# Patient Record
Sex: Male | Born: 1970 | Race: Black or African American | Hispanic: No | Marital: Married | Smoking: Never smoker
Health system: Southern US, Community
[De-identification: ages and names within clinical notes are randomized; demographics above are authoritative.]

## PROBLEM LIST (undated history)

## (undated) DIAGNOSIS — K219 Gastro-esophageal reflux disease without esophagitis: Secondary | ICD-10-CM

## (undated) DIAGNOSIS — Z8489 Family history of other specified conditions: Secondary | ICD-10-CM

## (undated) DIAGNOSIS — G8929 Other chronic pain: Secondary | ICD-10-CM

## (undated) DIAGNOSIS — M5412 Radiculopathy, cervical region: Secondary | ICD-10-CM

## (undated) DIAGNOSIS — R569 Unspecified convulsions: Secondary | ICD-10-CM

## (undated) DIAGNOSIS — R519 Headache, unspecified: Secondary | ICD-10-CM

## (undated) DIAGNOSIS — F909 Attention-deficit hyperactivity disorder, unspecified type: Secondary | ICD-10-CM

## (undated) DIAGNOSIS — F0781 Postconcussional syndrome: Secondary | ICD-10-CM

## (undated) DIAGNOSIS — M1990 Unspecified osteoarthritis, unspecified site: Secondary | ICD-10-CM

## (undated) DIAGNOSIS — I10 Essential (primary) hypertension: Secondary | ICD-10-CM

## (undated) DIAGNOSIS — R51 Headache: Secondary | ICD-10-CM

## (undated) DIAGNOSIS — M545 Low back pain, unspecified: Secondary | ICD-10-CM

## (undated) DIAGNOSIS — M48061 Spinal stenosis, lumbar region without neurogenic claudication: Secondary | ICD-10-CM

## (undated) HISTORY — DX: Spinal stenosis, lumbar region without neurogenic claudication: M48.061

## (undated) HISTORY — PX: WRIST SURGERY: SHX841

## (undated) HISTORY — DX: Radiculopathy, cervical region: M54.12

## (undated) HISTORY — DX: Attention-deficit hyperactivity disorder, unspecified type: F90.9

## (undated) HISTORY — DX: Postconcussional syndrome: F07.81

---

## 2008-03-10 HISTORY — PX: SHOULDER ARTHROSCOPY W/ ROTATOR CUFF REPAIR: SHX2400

## 2013-03-10 HISTORY — PX: ACHILLES TENDON SURGERY: SHX542

## 2014-03-10 HISTORY — PX: APPENDECTOMY: SHX54

## 2014-09-20 ENCOUNTER — Observation Stay (HOSPITAL_COMMUNITY): Payer: BLUE CROSS/BLUE SHIELD

## 2014-09-20 ENCOUNTER — Emergency Department (HOSPITAL_COMMUNITY): Payer: BLUE CROSS/BLUE SHIELD

## 2014-09-20 ENCOUNTER — Encounter (HOSPITAL_COMMUNITY): Payer: Self-pay | Admitting: Emergency Medicine

## 2014-09-20 ENCOUNTER — Observation Stay (HOSPITAL_COMMUNITY)
Admission: EM | Admit: 2014-09-20 | Discharge: 2014-09-21 | Disposition: A | Discharge status: Home / Self Care | Payer: BLUE CROSS/BLUE SHIELD | Attending: Internal Medicine | Admitting: Internal Medicine

## 2014-09-20 DIAGNOSIS — D649 Anemia, unspecified: Secondary | ICD-10-CM | POA: Insufficient documentation

## 2014-09-20 DIAGNOSIS — M5412 Radiculopathy, cervical region: Secondary | ICD-10-CM

## 2014-09-20 DIAGNOSIS — R531 Weakness: Secondary | ICD-10-CM

## 2014-09-20 DIAGNOSIS — M4722 Other spondylosis with radiculopathy, cervical region: Secondary | ICD-10-CM | POA: Insufficient documentation

## 2014-09-20 DIAGNOSIS — R202 Paresthesia of skin: Secondary | ICD-10-CM | POA: Diagnosis not present

## 2014-09-20 DIAGNOSIS — R0789 Other chest pain: Secondary | ICD-10-CM

## 2014-09-20 DIAGNOSIS — I10 Essential (primary) hypertension: Secondary | ICD-10-CM | POA: Diagnosis not present

## 2014-09-20 DIAGNOSIS — R079 Chest pain, unspecified: Secondary | ICD-10-CM | POA: Diagnosis not present

## 2014-09-20 DIAGNOSIS — R52 Pain, unspecified: Secondary | ICD-10-CM

## 2014-09-20 DIAGNOSIS — M25512 Pain in left shoulder: Secondary | ICD-10-CM | POA: Diagnosis present

## 2014-09-20 HISTORY — DX: Unspecified osteoarthritis, unspecified site: M19.90

## 2014-09-20 HISTORY — DX: Essential (primary) hypertension: I10

## 2014-09-20 HISTORY — DX: Family history of other specified conditions: Z84.89

## 2014-09-20 HISTORY — DX: Low back pain, unspecified: M54.50

## 2014-09-20 HISTORY — DX: Headache: R51

## 2014-09-20 HISTORY — DX: Other chronic pain: G89.29

## 2014-09-20 HISTORY — DX: Headache, unspecified: R51.9

## 2014-09-20 HISTORY — DX: Unspecified convulsions: R56.9

## 2014-09-20 HISTORY — DX: Low back pain: M54.5

## 2014-09-20 LAB — RAPID URINE DRUG SCREEN, HOSP PERFORMED
Amphetamines: NOT DETECTED
BARBITURATES: NOT DETECTED
BENZODIAZEPINES: NOT DETECTED
Cocaine: NOT DETECTED
Opiates: POSITIVE — AB
Tetrahydrocannabinol: NOT DETECTED

## 2014-09-20 LAB — BASIC METABOLIC PANEL
Anion gap: 6 (ref 5–15)
BUN: 9 mg/dL (ref 6–20)
CALCIUM: 9 mg/dL (ref 8.9–10.3)
CHLORIDE: 108 mmol/L (ref 101–111)
CO2: 27 mmol/L (ref 22–32)
Creatinine, Ser: 0.88 mg/dL (ref 0.61–1.24)
Glucose, Bld: 110 mg/dL — ABNORMAL HIGH (ref 65–99)
Potassium: 4 mmol/L (ref 3.5–5.1)
SODIUM: 141 mmol/L (ref 135–145)

## 2014-09-20 LAB — URINALYSIS, ROUTINE W REFLEX MICROSCOPIC
Bilirubin Urine: NEGATIVE
Glucose, UA: NEGATIVE mg/dL
HGB URINE DIPSTICK: NEGATIVE
Ketones, ur: NEGATIVE mg/dL
Leukocytes, UA: NEGATIVE
Nitrite: NEGATIVE
Protein, ur: NEGATIVE mg/dL
Specific Gravity, Urine: 1.016 (ref 1.005–1.030)
Urobilinogen, UA: 0.2 mg/dL (ref 0.0–1.0)
pH: 6.5 (ref 5.0–8.0)

## 2014-09-20 LAB — IRON AND TIBC
Iron: 60 ug/dL (ref 45–182)
SATURATION RATIOS: 20 % (ref 17.9–39.5)
TIBC: 304 ug/dL (ref 250–450)
UIBC: 244 ug/dL

## 2014-09-20 LAB — LIPID PANEL
CHOL/HDL RATIO: 3 ratio
Cholesterol: 166 mg/dL (ref 0–200)
HDL: 56 mg/dL (ref >40)
LDL Cholesterol: 98 mg/dL (ref 0–99)
Triglycerides: 58 mg/dL (ref <150)
VLDL: 12 mg/dL (ref 0–40)

## 2014-09-20 LAB — I-STAT TROPONIN, ED: Troponin i, poc: 0.01 ng/mL (ref 0.00–0.08)

## 2014-09-20 LAB — CBC WITH DIFFERENTIAL/PLATELET
BASOS ABS: 0 10*3/uL (ref 0.0–0.1)
Basophils Relative: 0 % (ref 0–1)
EOS ABS: 0.1 10*3/uL (ref 0.0–0.7)
Eosinophils Relative: 2 % (ref 0–5)
HCT: 40.2 % (ref 39.0–52.0)
Hemoglobin: 11.4 g/dL — ABNORMAL LOW (ref 13.0–17.0)
LYMPHS ABS: 1.9 10*3/uL (ref 0.7–4.0)
LYMPHS PCT: 30 % (ref 12–46)
MCH: 22.9 pg — ABNORMAL LOW (ref 26.0–34.0)
MCHC: 28.4 g/dL — AB (ref 30.0–36.0)
MCV: 80.9 fL (ref 78.0–100.0)
MONOS PCT: 12 % (ref 3–12)
Monocytes Absolute: 0.8 10*3/uL (ref 0.1–1.0)
NEUTROS ABS: 3.5 10*3/uL (ref 1.7–7.7)
Neutrophils Relative %: 56 % (ref 43–77)
Platelets: 180 10*3/uL (ref 150–400)
RBC: 4.97 MIL/uL (ref 4.22–5.81)
RDW: 13.9 % (ref 11.5–15.5)
WBC: 6.3 10*3/uL (ref 4.0–10.5)

## 2014-09-20 LAB — TROPONIN I: Troponin I: <0.03 ng/mL (ref <0.031)

## 2014-09-20 LAB — RETICULOCYTES
RBC.: 5.08 MIL/uL (ref 4.22–5.81)
Retic Count, Absolute: 55.9 10*3/uL (ref 19.0–186.0)
Retic Ct Pct: 1.1 % (ref 0.4–3.1)

## 2014-09-20 LAB — FERRITIN: FERRITIN: 57 ng/mL (ref 24–336)

## 2014-09-20 MED ORDER — NITROGLYCERIN 0.4 MG SL SUBL
0.4000 mg | SUBLINGUAL_TABLET | SUBLINGUAL | Status: DC | PRN
  Administered 2014-09-20 (×2): 0.4 mg via SUBLINGUAL
  Filled 2014-09-20: qty 1

## 2014-09-20 MED ORDER — ENOXAPARIN SODIUM 40 MG/0.4ML ~~LOC~~ SOLN
40.0000 mg | SUBCUTANEOUS | Status: DC
  Administered 2014-09-20: 40 mg via SUBCUTANEOUS
  Filled 2014-09-20 (×2): qty 0.4

## 2014-09-20 MED ORDER — ASPIRIN EC 81 MG PO TBEC
81.0000 mg | DELAYED_RELEASE_TABLET | Freq: Every day | ORAL | Status: DC
Start: 2014-09-21 — End: 2014-09-21
  Administered 2014-09-21: 81 mg via ORAL
  Filled 2014-09-20: qty 1

## 2014-09-20 MED ORDER — MORPHINE SULFATE 4 MG/ML IJ SOLN
2.0000 mg | Freq: Once | INTRAMUSCULAR | Status: AC
  Administered 2014-09-20: 2 mg via INTRAVENOUS
  Filled 2014-09-20: qty 1

## 2014-09-20 MED ORDER — MORPHINE SULFATE 2 MG/ML IJ SOLN: 2.0000 mg | INTRAMUSCULAR | Status: DC | PRN

## 2014-09-20 MED ORDER — IBUPROFEN 200 MG PO TABS
600.0000 mg | ORAL_TABLET | Freq: Four times a day (QID) | ORAL | Status: DC | PRN
  Administered 2014-09-21 (×2): 600 mg via ORAL
  Filled 2014-09-20 (×2): qty 3

## 2014-09-20 MED ORDER — SODIUM CHLORIDE 0.9 % IV SOLN: Freq: Once | INTRAVENOUS | Status: DC

## 2014-09-20 MED ORDER — ASPIRIN 81 MG PO CHEW
324.0000 mg | CHEWABLE_TABLET | Freq: Once | ORAL | Status: AC
  Administered 2014-09-20: 324 mg via ORAL
  Filled 2014-09-20: qty 4

## 2014-09-20 MED ORDER — SODIUM CHLORIDE 0.9 % IJ SOLN: 3.0000 mL | Freq: Two times a day (BID) | INTRAMUSCULAR | Status: DC

## 2014-09-20 MED ORDER — SODIUM CHLORIDE 0.9 % IV SOLN
INTRAVENOUS | Status: DC
  Administered 2014-09-20: 150 mL/h via INTRAVENOUS
  Administered 2014-09-20: via INTRAVENOUS

## 2014-09-20 MED ORDER — SODIUM CHLORIDE 0.9 % IV SOLN
1000.0000 mL | Freq: Once | INTRAVENOUS | Status: AC
  Administered 2014-09-20: 1000 mL via INTRAVENOUS

## 2014-09-20 NOTE — ED Provider Notes (Signed)
Medical screening examination/treatment/procedure(s) were conducted as a shared visit with non-physician practitioner(s) and myself.  I personally evaluated the patient during the encounter.   EKG Interpretation   Date/Time:  Wednesday September 20 2014 09:31:08 EDT Ventricular Rate:  82 PR Interval:  143 QRS Duration: 88 QT Interval:  353 QTC Calculation: 412 R Axis:   74 Text Interpretation:  Sinus rhythm Borderline T abnormalities, diffuse  leads No old tracing to compare Confirmed by Annaliesa Blann  MD, Lasondra Hodgkins ([id number]) on  09/20/2014 9:40:17 AM     44yM with L shoulder/arm pain. 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 Progressed to L lateral neck and upper arm and also numbness in L hand. Had walked about a mile prior to the onset of symptoms. Doesn't feel like he strained LUE. Was holding marking stick with R hand.  Associated with nausea nausea and dizziness. Symptoms improved since onset, but still persistent vague pressure. No hx of similar type pain. Has hx of HTN. No known CAD. Reports previous stress testing ~10 years ago.   Concern for possible ACS.  EKG with nonspecific changes. Flattening/TWI inferiorly. No old for comparison. Initial troponin normal. Not clearly cervical radiculopathy or musculoskeletal. Concerning enough that I feel prudent to admit for r/o.   Jonathan RazorStephen Tatiyanna Lashley, MD 09/20/14 [phone]

## 2014-09-20 NOTE — H&P (Signed)
Date: 09/20/2014               Patient Name:  Jonathan Everett MRN: [id number]  DOB: [birth year] Age / Sex: 44 y.o., male   PCP: No primary care provider on file.         Medical Service: Internal Medicine Teaching Service         Attending Physician: Dr. Earl Lagos, MD    First Contact: Dr. Isabella Bowens Pager: [phone]  Second Contact: Dr. Yetta Barre Pager: [phone]       After Hours (After 5p/  First Contact Pager: [phone]  weekends / holidays): Second Contact Pager: [phone]   Chief Complaint: L shoulder and chest pain  History of Present Illness: [name] is a 44 year old man with history of hypertension presenting with L shoulder and chest pain. The pain started today while he was walking at work outside. He noticed a constant, cramping pain the back of his L shoulder. He then had numbness going down to his L hand. He felt lightheaded. He has lightheadedness with standing. The pain radiated up the L side of his neck. He had chest tightness in L lower chest that comes and goes. Resting makes the pain better and walking makes the pain worse. Deep breaths also make the pain worse. No change with leaning forward. He had associated diaphoresis and nausea. He denies emesis or shortness of breath. He reports L leg tingling. Denies fevers, chills, vision changes, cough, abdominal pain, diarrhea, hematochezia, melena, dysuria, hematuria, rash, weakness. Denies history of VTE.   Meds: Current Facility-Administered Medications  Medication Dose Route Frequency Provider Last Rate Last Dose  . 0.9 %  sodium chloride infusion   Intravenous Continuous Courtney Paris, MD      . Melene Muller ON 09/21/2014] aspirin EC tablet 81 mg  81 mg Oral Daily Courtney Paris, MD      . enoxaparin (LOVENOX) injection 40 mg  40 mg Subcutaneous Q24H Courtney Paris, MD      . morphine 2 MG/ML injection 2 mg  2 mg Intravenous Q4H PRN Courtney Paris, MD      . nitroGLYCERIN (NITROSTAT) SL tablet 0.4 mg  0.4 mg Sublingual Q5 Min x 3  PRN Danelle Berry, PA-C   0.4 mg at 09/20/14 1117  . sodium chloride 0.9 % injection 3 mL  3 mL Intravenous Q12H Courtney Paris, MD       No prescriptions prior to admission    Allergies: Allergies as of 09/20/2014  . (No Known Allergies)   Past Medical History  Diagnosis Date  . Hypertension    Past Surgical History  Procedure Laterality Date  . Appendectomy    . Rotator cuff repair    . Wrist surgery    . Achilles tendon surgery     No family history on file.   History   Social History  . Marital Status: Married    Spouse Name: N/A  . Number of Children: N/A  . Years of Education: N/A   Occupational History  . Not on file.   Social History Main Topics  . Smoking status: Never Smoker   . Smokeless tobacco: Not on file  . Alcohol Use: Yes     Comment: socially  . Drug Use: No  . Sexual Activity: Not on file   Other Topics Concern  . Not on file   Social History Narrative  . No narrative on file    Review of Systems: Constitutional: no fevers/chills  Eyes: no vision changes Ears, nose, mouth, throat, and face: no cough Respiratory: no shortness of breath Cardiovascular: +chest pain Gastrointestinal: +nausea, no vomiting, no abdominal pain, no constipation, no diarrhea Genitourinary: no dysuria, no hematuria Integument: no rash Hematologic/lymphatic: no bleeding/bruising, no edema Musculoskeletal: no arthralgias, no myalgias Neurological: +paresthesias, no weakness  Physical Exam: Blood pressure 124/75, pulse 64, temperature 98.6 F (37 C), temperature source Oral, resp. rate 16, height 6' (1.829 m), weight 204 lb 8 oz (92.761 kg), SpO2 98 %. General Apperance: NAD Head: Normocephalic, atraumatic Eyes: PERRL, EOMI, anicteric sclera Ears: Normal external ear canal Nose: Nares normal, septum midline, mucosa normal Throat: Lips, mucosa and tongue normal  Neck: Supple, trachea midline Back: No tenderness or bony abnormality  Lungs: Clear to auscultation  bilaterally. No wheezes, rhonchi or rales. Breathing comfortably on RA. Chest Wall: TTP in left lower chest, no deformity Heart: Regular rate and rhythm, no murmur/rub/gallop Abdomen: Soft, nontender, nondistended, no rebound/guarding Extremities: Normal, atraumatic, warm and well perfused, no edema Pulses: 2+ throughout Skin: No rashes or lesions Neurologic: Alert and oriented x 3. CN V1 and V2 decreased sensation. Otherwise CNII-XII intact. Decreased L grip but otherwise normal strength and sensation.  Lab results: Basic Metabolic Panel:  Recent Labs  21/30/8607/13/16 1002  NA 141  K 4.0  CL 108  CO2 27  GLUCOSE 110*  BUN 9  CREATININE 0.88  CALCIUM 9.0   CBC:  Recent Labs  09/20/14 1002  WBC 6.3  NEUTROABS 3.5  HGB 11.4*  HCT 40.2  MCV 80.9  PLT 180   Cardiac Enzymes:  Recent Labs  09/20/14 1002  TROPONINI <0.03   Urine Drug Screen: Drugs of Abuse  No results found for: LABOPIA, COCAINSCRNUR, LABBENZ, AMPHETMU, THCU, LABBARB   Urinalysis:  Recent Labs  09/20/14 1115  COLORURINE YELLOW  LABSPEC 1.016  PHURINE 6.5  GLUCOSEU NEGATIVE  HGBUR NEGATIVE  BILIRUBINUR NEGATIVE  KETONESUR NEGATIVE  PROTEINUR NEGATIVE  UROBILINOGEN 0.2  NITRITE NEGATIVE  LEUKOCYTESUR NEGATIVE    Imaging results:  Dg Chest 2 View  09/20/2014   CLINICAL DATA:  Left chest pain.  Shortness of breath.  EXAM: CHEST  2 VIEW  COMPARISON:  None.  FINDINGS: The heart size and mediastinal contours are within normal limits. Both lungs are clear. The visualized skeletal structures are unremarkable.  IMPRESSION: No active cardiopulmonary disease.   Electronically Signed   By: Gaylyn RongWalter  Liebkemann M.D.   On: 09/20/2014 10:56    Other results: EKG: Normal sinus rhythm, T wave inversion in III, V1, V3, no previous EKG for comparison  Assessment & Plan by Problem: Active Problems:   Left shoulder pain   Chest pain  Chest pain: Pain is reproducible on palpation making MSK etiology possible.  CXR with no acute cardiopulmonary process. Do not suspect PE as Wells score 0 and Geneva score 3 (heart rate 75-94) low risk for PE. Initial troponin negative and EKG without acute ischemic changes. His HEART score is 2 points and TIMI score is 0. He received ASA 325mg  x 1.  -UDS pending -repeat EKG in AM -trend troponins -lipid panel -hemoglobin A1c  -ASA 81mg  daily -ibuprofen 600mg  q6 hr prn pain -nitrostat SL 0.4mg  prn chest pain -tele  Paresthesias, mild weakness: concern for TIA/CVA versus radiculopathy -CT head  Anemia: Hgb on admission 11.4. No previous labs for comparison. MCV normal at 80.9. -Obtain Iron, TIBC, Ferritin -Reticulocytes -FOBT  FEN:  -heart healthy -NS @150ml /hr  VTE ppx: Lovenox  Dispo: Disposition is deferred  at this time, awaiting improvement of current medical problems. Anticipated discharge in approximately 1-2 day(s).   The patient does have a current PCP (No primary care provider on file.) and does not need an [facility] hospital follow-up appointment after discharge.  The patient does not know have transportation limitations that hinder transportation to clinic appointments.  Signed: Lora Paula, MD  09/20/2014, 1:50 PM

## 2014-09-20 NOTE — ED Notes (Signed)
Patient made aware of need for Urine specimen, urinal at bedside.

## 2014-09-20 NOTE — ED Provider Notes (Signed)
CSN: [id number]     Arrival date & time 09/20/14  [id number] History   First MD Initiated Contact with Patient 09/20/14 [id number]     Chief Complaint  Patient presents with  . Shoulder Pain  . Chest Pain     (Consider location/radiation/quality/duration/timing/severity/associated sxs/prior Treatment) Patient is a 44 y.o. male presenting with dizziness and shoulder pain. The history is provided by the patient. No language interpreter was used.  Dizziness Associated symptoms: chest pain, headaches, nausea and palpitations   Associated symptoms: no diarrhea, no shortness of breath, no vomiting and no weakness   Shoulder Pain Associated symptoms: back pain and neck pain   Associated symptoms: no fever     [name] is a 44 year old male with history of hypertension, who presents with sudden onset left back pain that occurred while walking at work. The pain radiated up his left side of his neck and to his left shoulder and down his left arm, with a squeezing and achy quality and associated numbness and tingling of his left hand.  He also had associated nausea, diaphoresis, lightheadedness, palpitations described as "a flutter" and headache.  The pain began at 8:30 this morning, and slightly decreased with resting, but has not completely resolved. He was able to have his wife get him from work and bring him to the emergency department where the pain has been persistent, but currently rated 8/10.  He did not have any associated shortness of breath, PND, orthopnea, lower extremity edema, vomiting, diarrhea, and abdominal pain.  He denies any smoking history, drinks wine socially on occasion.  He denies any illegal drug use.  He is currently on lisinopril 10 mg and HCTZ to treat his hypertension.  He reports a history of hypertension since he was 44 years old, and had a treadmill stress test approximately 10 years ago, which is reportedly negative.  It is not known the etiology of his hypertension, and he is  unsure if he's ever had a ECHO performed.  He denies any risk factors for blood clots, denies any cancer history, hypercoagulable genetic diseases, and denies any recent travel or period of stasis.    Past Medical History  Diagnosis Date  . Hypertension    Past Surgical History  Procedure Laterality Date  . Appendectomy    . Rotator cuff repair    . Wrist surgery    . Achilles tendon surgery     No family history on file. History  Substance Use Topics  . Smoking status: Never Smoker   . Smokeless tobacco: Not on file  . Alcohol Use: Yes     Comment: socially    Review of Systems  Constitutional: Positive for diaphoresis. Negative for fever.  Eyes: Negative for photophobia and visual disturbance.  Respiratory: Positive for chest tightness. Negative for cough, shortness of breath and wheezing.   Cardiovascular: Positive for chest pain and palpitations. Negative for leg swelling.  Gastrointestinal: Positive for nausea. Negative for vomiting, abdominal pain, diarrhea and abdominal distention.  Endocrine: Negative.   Genitourinary: Negative.   Musculoskeletal: Positive for back pain and neck pain. Negative for neck stiffness.  Skin: Negative.   Neurological: Positive for light-headedness, numbness and headaches. Negative for dizziness, tremors, seizures, syncope, facial asymmetry and weakness.  Psychiatric/Behavioral: Negative.     Allergies  Review of patient's allergies indicates no known allergies.  Home Medications   Prior to Admission medications   Not on File   BP 136/74 mmHg  Pulse 84  Temp(Src) 98.6 F (37  C) (Oral)  Resp 12  SpO2 100% Physical Exam  Constitutional: He is oriented to person, place, and time. Vital signs are normal. He appears well-developed and well-nourished. He does not appear ill. No distress.  Well-developed, well-nourished male, appears stated age, appears uncomfortable  HENT:  Head: Normocephalic and atraumatic.  Right Ear: External ear  normal.  Left Ear: External ear normal.  Nose: Nose normal.  Mouth/Throat: Oropharynx is clear and moist. No oropharyngeal exudate.  Eyes: Conjunctivae, EOM and lids are normal. Pupils are equal, round, and reactive to light. Right eye exhibits no discharge. Left eye exhibits no discharge. No scleral icterus.  Neck: Trachea normal, normal range of motion and phonation normal. Neck supple. Normal carotid pulses, no hepatojugular reflux and no JVD present. Carotid bruit is not present. No tracheal deviation present. No thyromegaly present.  Cardiovascular: Normal rate, regular rhythm, normal heart sounds and intact distal pulses.  PMI is not displaced.  Exam reveals no gallop, no friction rub and no decreased pulses.   No murmur heard. Pulses:      Carotid pulses are 2+ on the right side, and 2+ on the left side.      Radial pulses are 2+ on the right side, and 2+ on the left side.       Dorsalis pedis pulses are 2+ on the right side, and 2+ on the left side.  loccasional PVC's Pulses symmetrical and 2+ bilaterally and carotid, radial, DP artery, no bruits auscultated  Pulmonary/Chest: Effort normal and breath sounds normal. No respiratory distress. He has no wheezes. He has no rales. Chest wall is not dull to percussion. He exhibits no tenderness, no bony tenderness, no crepitus, no deformity and no retraction.  Abdominal: Soft. Bowel sounds are normal. He exhibits no distension and no mass. There is no tenderness. There is no rebound and no guarding.  Musculoskeletal: Normal range of motion. He exhibits no edema or tenderness.  Lymphadenopathy:    He has no cervical adenopathy.  Neurological: He is alert and oriented to person, place, and time. He has normal reflexes. No cranial nerve deficit. He exhibits normal muscle tone. Coordination normal.  Skin: Skin is warm, dry and intact. No rash noted. He is not diaphoretic. No cyanosis or erythema. No pallor. Nails show no clubbing.  No lower  extremity edema  Psychiatric: He has a normal mood and affect. His behavior is normal. Judgment and thought content normal.  Nursing note and vitals reviewed.   ED Course  Procedures (including critical care time) Labs Review Labs Reviewed  CBC WITH DIFFERENTIAL/PLATELET - Abnormal; Notable for the following:    Hemoglobin 11.4 (*)    MCH 22.9 (*)    MCHC 28.4 (*)    All other components within normal limits  BASIC METABOLIC PANEL - Abnormal; Notable for the following:    Glucose, Bld 110 (*)    All other components within normal limits  TROPONIN I  URINALYSIS, ROUTINE W REFLEX MICROSCOPIC (NOT AT [facility])  Rosezena Sensor, ED    Imaging Review Dg Chest 2 View  09/20/2014   CLINICAL DATA:  Left chest pain.  Shortness of breath.  EXAM: CHEST  2 VIEW  COMPARISON:  None.  FINDINGS: The heart size and mediastinal contours are within normal limits. Both lungs are clear. The visualized skeletal structures are unremarkable.  IMPRESSION: No active cardiopulmonary disease.   Electronically Signed   By: Gaylyn Rong M.D.   On: 09/20/2014 10:56     EKG Interpretation  Date/Time:  Wednesday September 20 2014 09:31:08 EDT Ventricular Rate:  82 PR Interval:  143 QRS Duration: 88 QT Interval:  353 QTC Calculation: 412 R Axis:   74 Text Interpretation:  Sinus rhythm Borderline T abnormalities, diffuse  leads No old tracing to compare Confirmed by Juleen China  MD, STEPHEN ([id number]) on  09/20/2014 9:40:17 AM      MDM   Final diagnoses:  None   Patient with chief complained of left shoulder and chest pain with radiation to left neck and jaw and left arm with associated numbness - concerning for ACS/STEMI/NSTEMI  Plan - EKG, CXR, CBC, BMP, UA, troponin, cardiac monitoring, ASA, morphine, sublingual NG  EKG reveals T-wave inversions in the inferior leads II, III, without any prior EKG to compare to.   Initial troponin is negative, labs pertinent for mild anemia Vital signs stable here in the  ER, without tachycardia, hypoxia or hypertension  Left-sided chest pain is now rated 3 out of 10, will give another NG Patient's presentation concerning for unstable angina/ACS, patient will likely need admission for R/O ACS/NSTEMI with serial cardiac enzymes.  Dr. Juleen China has seen and evaluated the pt.  Please see his documentation.  Hospitalist has been called for admission. 11:39 AM Danelle Berry, PA-C     Danelle Berry, PA-C 09/28/14 [id number]  Raeford Razor, MD 09/28/14 [phone]

## 2014-09-20 NOTE — ED Notes (Signed)
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 Pt alert, oriented x4, VSS.

## 2014-09-21 ENCOUNTER — Encounter: Payer: Self-pay | Admitting: Internal Medicine

## 2014-09-21 DIAGNOSIS — M5412 Radiculopathy, cervical region: Secondary | ICD-10-CM

## 2014-09-21 DIAGNOSIS — R079 Chest pain, unspecified: Secondary | ICD-10-CM

## 2014-09-21 DIAGNOSIS — D649 Anemia, unspecified: Secondary | ICD-10-CM | POA: Diagnosis not present

## 2014-09-21 DIAGNOSIS — I10 Essential (primary) hypertension: Secondary | ICD-10-CM | POA: Diagnosis not present

## 2014-09-21 DIAGNOSIS — M4722 Other spondylosis with radiculopathy, cervical region: Secondary | ICD-10-CM | POA: Diagnosis not present

## 2014-09-21 LAB — TROPONIN I: Troponin I: <0.03 ng/mL (ref <0.031)

## 2014-09-21 LAB — HEMOGLOBIN A1C
Hgb A1c MFr Bld: 5.2 % (ref 4.8–5.6)
Mean Plasma Glucose: 103 mg/dL

## 2014-09-21 LAB — TSH: TSH: 2.166 u[IU]/mL (ref 0.350–4.500)

## 2014-09-21 MED ORDER — IBUPROFEN 600 MG PO TABS: 600.0000 mg | ORAL_TABLET | Freq: Four times a day (QID) | ORAL | Status: DC | PRN

## 2014-09-21 NOTE — Progress Notes (Signed)
Subjective: No acute events overnight. Doing well this morning. He reports his pain is improved.   Objective: Vital signs in last 24 hours: Filed Vitals:   09/20/14 1308 09/20/14 1317 09/20/14 2105 09/21/14 0353  BP: 124/75  137/77 126/85  Pulse: 64  79 62  Temp: 98.2 F (36.8 C)  98.1 F (36.7 C) 98.2 F (36.8 C)  TempSrc:   Oral Oral  Resp: Height:  6' (1.829 m)    Weight:  204 lb 8 oz (92.761 kg)    SpO2:    100%   Weight change:   Intake/Output Summary (Last 24 hours) at 09/21/14 0955 Last data filed at 09/20/14 1900  Gross per 24 hour  Intake 1757.5 ml  Output      0 ml  Net 1757.5 ml   General Apperance: NAD HEENT: Normocephalic, atraumatic, PERRL, EOMI, anicteric sclera Neck: Supple, trachea midline Lungs: Clear to auscultation bilaterally. No wheezes, rhonchi or rales. Breathing comfortably Heart: Regular rate and rhythm, no murmur/rub/gallop Abdomen: Soft, nontender, nondistended, no rebound/guarding Extremities: Normal, atraumatic, warm and well perfused, no edema Pulses: 2+ throughout Skin: No rashes or lesions Neurologic: Alert and oriented x 3. CNII-XII intact. Normal strength and sensation  Lab Results: Basic Metabolic Panel:  Recent Labs Lab 09/20/14 1002  NA 141  K 4.0  CL 108  CO2 27  GLUCOSE 110*  BUN 9  CREATININE 0.88  CALCIUM 9.0   CBC:  Recent Labs Lab 09/20/14 1002  WBC 6.3  NEUTROABS 3.5  HGB 11.4*  HCT 40.2  MCV 80.9  PLT 180   Cardiac Enzymes:  Recent Labs Lab 09/20/14 1002 09/20/14 1544 09/21/14 0250  TROPONINI <0.03 <0.03 <0.03   Hemoglobin A1C:  Recent Labs Lab 09/20/14 1544  HGBA1C 5.2   Fasting Lipid Panel:  Recent Labs Lab 09/20/14 1544  CHOL 166  HDL 56  LDLCALC 98  TRIG 58  CHOLHDL 3.0   Thyroid Function Tests:  Recent Labs Lab 09/21/14 0400  TSH 2.166   Anemia Panel:  Recent Labs Lab 09/20/14 1544 09/20/14 1807  FERRITIN  --  57  TIBC  --  304  IRON  --  60    RETICCTPCT 1.1  --    Urine Drug Screen: Drugs of Abuse     Component Value Date/Time   LABOPIA POSITIVE* 09/20/2014 1115   COCAINSCRNUR NONE DETECTED 09/20/2014 1115   LABBENZ NONE DETECTED 09/20/2014 1115   AMPHETMU NONE DETECTED 09/20/2014 1115   THCU NONE DETECTED 09/20/2014 1115   LABBARB NONE DETECTED 09/20/2014 1115    Urinalysis:  Recent Labs Lab 09/20/14 1115  COLORURINE YELLOW  LABSPEC 1.016  PHURINE 6.5  GLUCOSEU NEGATIVE  HGBUR NEGATIVE  BILIRUBINUR NEGATIVE  KETONESUR NEGATIVE  PROTEINUR NEGATIVE  UROBILINOGEN 0.2  NITRITE NEGATIVE  LEUKOCYTESUR NEGATIVE    Studies/Results: Dg Chest 2 View  09/20/2014   CLINICAL DATA:  Left chest pain.  Shortness of breath.  EXAM: CHEST  2 VIEW  COMPARISON:  None.  FINDINGS: The heart size and mediastinal contours are within normal limits. Both lungs are clear. The visualized skeletal structures are unremarkable.  IMPRESSION: No active cardiopulmonary disease.   Electronically Signed   By: Gaylyn Rong M.D.   On: 09/20/2014 10:56   Dg Cervical Spine Complete  09/20/2014   CLINICAL DATA:  44 year old with left shoulder and arm pain. No acute injury. Initial encounter.  EXAM: CERVICAL SPINE  4+ VIEWS  COMPARISON:  None.  FINDINGS: The  prevertebral soft tissues are normal. The alignment is anatomic through T1. There is no evidence of acute fracture or traumatic subluxation. The C1-2 articulation appears normal in the AP projection. There is mild disc space loss and uncinate spurring in the lower cervical spine, greatest at C6-7. Oblique views demonstrate mild left-sided foraminal narrowing at C6-7.  IMPRESSION: No acute osseous findings or malalignment. Mild spondylosis with mild left-sided foraminal narrowing at C6-7.   Electronically Signed   By: Carey BullocksWilliam  Veazey M.D.   On: 09/20/2014 14:46   Ct Head Wo Contrast  09/20/2014   CLINICAL DATA:  Headache with left arm numbness today. Initial encounter.  EXAM: CT HEAD WITHOUT  CONTRAST  TECHNIQUE: Contiguous axial images were obtained from the base of the skull through the vertex without intravenous contrast.  COMPARISON:  None.  FINDINGS: There is no evidence of acute intracranial hemorrhage, mass lesion, brain edema or extra-axial fluid collection. The ventricles and subarachnoid spaces are appropriately sized for age. There is no CT evidence of acute cortical infarction. There is low-density within the sella turcica, suggesting possible partially empty sella.  There is a mucous retention cyst in the left maxillary sinus. The visualized paranasal sinuses, mastoid air cells and middle ears are otherwise clear. The calvarium is intact.  IMPRESSION: No acute intracranial findings. Possible partially empty sella and left maxillary sinus mucous retention cyst.   Electronically Signed   By: Carey BullocksWilliam  Veazey M.D.   On: 09/20/2014 15:04   Medications: I have reviewed the patient's current medications. Scheduled Meds: . aspirin EC  81 mg Oral Daily  . enoxaparin (LOVENOX) injection  40 mg Subcutaneous Q24H  . sodium chloride  3 mL Intravenous Q12H   Continuous Infusions:  PRN Meds:.ibuprofen, nitroGLYCERIN Assessment/Plan: Active Problems:   Left shoulder pain   Chest pain   Cervical radiculopathy  Chest pain: Pain is reproducible on palpation making MSK etiology likely. UDS unremarkable. Troponins negative x 3. Repeat EKG unremarkable. Total cholesterol 166, HDL 56 and LDL 98. Hgb A1c 5.2%. 3.7% 10-year risk of heart disease or stroke.  Cervical radiculopathy: CT head with no acute intracranial findings. XR cervical spine  With no acute osseous findings or malalignment. Mild spondylosis with mild left-sided foraminal narrowing at C6-7. -ibuprofen 600mg  q6 hr prn pain  Anemia: Hgb on admission 11.4. No previous labs for comparison. MCV normal at 80.9. Iron, TIBC, and Ferritin within normal limits. Reticulocytes normal. TSH wnl. Will have him follow up with PCP.  FEN: heart  healthy  VTE ppx: Lovenox  Dispo: Likely home today  The patient does not have a current PCP (Pcp Not In System) and does not need [facility] follow-[facility] appointment after discharge.  The patient does not know have transportation limitations that hinder transportation to clinic appointments.  .Services Needed at time of discharge: Y = Yes, Blank = No PT:   OT:   RN:   Equipment:   Other:       Lora PaulaJennifer T Breanne Olvera, MD 09/21/2014, 9:55 AM

## 2014-09-21 NOTE — Progress Notes (Signed)
Note for work delivered to pt and pt d/c'd via wheelchair with belongings with family, escorted by hospital volunteer.

## 2014-09-21 NOTE — Progress Notes (Signed)
Pt states he needs a note to return to work. Dr Isabella BowensKrall notified, will get a note for pt and bring it up.

## 2014-09-21 NOTE — Care Management Note (Signed)
Case Management Note  Patient Details  Name: Timoteo Gaulemus [facility] MRN: [id number] Date of Birth: 05/16/1970  Subjective/Objective: Pt was admitted with left shoulder pain                   Action/Plan:  Pt is independent from home, recently moved to area about a week ago, and therefore has not found a PCP in area.  CM will assist with locating PCP   Expected Discharge Date:                  Expected Discharge Plan:  Home/Self Care  In-House Referral:     Discharge planning Services  CM Consult (PCP)  Post Acute Care Choice:    Choice offered to:     DME Arranged:    DME Agency:     HH Arranged:    HH Agency:     Status of Service:  In process, will continue to follow  Medicare Important Message Given:    Date Medicare IM Given:    Medicare IM give by:    Date Additional Medicare IM Given:    Additional Medicare Important Message give by:     If discussed at Long Length of Stay Meetings, dates discussed:    Additional Comments: CM was informed by pt that he recently moved to the area.  CM provided Health Connect information to pt and instructed pt that this service would help him find a [facility] provider that would accept his insurance.  CM also informed pt that he could call the number on the back of his insurance card to request assistance with locating an in network provider.  CM will continue to monitor pt for dipsosition needs Cherylann ParrClaxton, Jhayla Podgorski S, RN 09/21/2014, 12:37 PM

## 2014-09-21 NOTE — Progress Notes (Signed)
D/c instructions reviewed with pt and family. Copy of instructions and script given to pt. Pt waiting for hospital volunteer to come with wheelchair for discharge.

## 2014-09-21 NOTE — Discharge Instructions (Signed)
Chest Pain It is often hard to give a specific diagnosis for the cause of chest pain. There is always a chance that your pain could be related to something serious, such as a heart attack or a blood clot in the lungs. You need to follow up with your health care provider for further evaluation. CAUSES   Heartburn.  Pneumonia or bronchitis.  Anxiety or stress.  Inflammation around your heart (pericarditis) or lung (pleuritis or pleurisy).  A blood clot in the lung.  A collapsed lung (pneumothorax). It can develop suddenly on its own (spontaneous pneumothorax) or from trauma to the chest.  Shingles infection (herpes zoster virus). The chest wall is composed of bones, muscles, and cartilage. Any of these can be the source of the pain.  The bones can be bruised by injury.  The muscles or cartilage can be strained by coughing or overwork.  The cartilage can be affected by inflammation and become sore (costochondritis). TREATMENT   Treatment depends on what may be causing your chest pain. Treatment may include:  Acid blockers for heartburn.  Anti-inflammatory medicine.  Pain medicine for inflammatory conditions.  Antibiotics if an infection is present.  Most of the time, nonspecific chest pain will improve within 2-3 days with rest and mild pain medicine.  HOME CARE INSTRUCTIONS   Do not use any tobacco products, including cigarettes, chewing tobacco, or electronic cigarettes.  Avoid drinking alcohol.  Only take medicine as directed by your health care provider.  Follow your health care provider's suggestions for further testing if your chest pain does not go away.  Keep any follow-up appointments you made. If you do not go to an appointment, you could develop lasting (chronic) problems with pain. If there is any problem keeping an appointment, call to reschedule. SEEK MEDICAL CARE IF:   Your chest pain does not go away, even after treatment.  You have a rash with  blisters on your chest.  You have a fever. SEEK IMMEDIATE MEDICAL CARE IF:   You have increased chest pain or pain that spreads to your arm, neck, jaw, back, or abdomen.  You have shortness of breath.  You have an increasing cough, or you cough up blood.  You have severe back or abdominal pain.  You feel nauseous or vomit.  You have severe weakness.  You faint.  You have chills. This is an emergency. Do not wait to see if the pain will go away. Get medical help at once. Call your local emergency services (911 in [place] Do not drive yourself to the hospital.   Cervical Radiculopathy Cervical radiculopathy means a nerve in the neck is pinched or bruised. This can cause pain or loss of feeling (numbness) that runs from your neck to your arm and fingers. HOME CARE   Put ice on the injured or painful area.  Put ice in a plastic bag.  Place a towel between your skin and the bag.  Leave the ice on for 15-20 minutes, 03-04 times a day, or as told by your doctor.  If ice does not help, you can try using heat. Take a warm shower or bath, or use a hot water bottle as told by your doctor.  You may try a gentle neck and shoulder massage.  Use a flat pillow when you sleep.  Only take medicines as told by your doctor.  GET HELP RIGHT AWAY IF:   Your pain gets worse and is not controlled with medicine.  You lose feeling or  feel weak in your hand, arm, face, or leg.  You have a fever or stiff neck.  You cannot control when you poop or pee (incontinence).  You have trouble with walking, balance, or speaking. MAKE SURE YOU:   Understand these instructions.  Will watch your condition.  Will get help right away if you are not doing well or get worse. Document Released: 02/13/2011 Document Revised: 05/19/2011 Document Reviewed: 02/13/2011 [facility] Patient Information 2015 [place], [place]. This information is not intended to replace advice given to you by your health care  provider. Make sure you discuss any questions you have with your health care provider.

## 2014-09-21 NOTE — Discharge Summary (Signed)
Name: Jonathan Everett MRN: [id number] DOB: 1970-06-07 44 y.o. PCP: Pcp Not In System  Date of Admission: 09/20/2014  9:21 AM Date of Discharge: 09/21/2014 Attending Physician: Earl Lagos, MD  Discharge Diagnosis: Principal Problem:   Chest pain Active Problems:   Cervical radiculopathy   Normocytic anemia  Discharge Medications:   Medication List    TAKE these medications        ibuprofen 600 MG tablet  Commonly known as:  ADVIL,MOTRIN  Take 1 tablet (600 mg total) by mouth every 6 (six) hours as needed for headache or moderate pain.        Disposition and follow-up:   Mr.Omair Caponi was discharged from [facility] in Stable condition.  At the hospital follow up visit please address:  1.  Chest pain: 3.7% 10-year risk of heart disease or stroke. If he has recurrent chest pain, may consider stress testing.   Cervical radiculopathy: Mild spondylosis with mild left-sided foraminal narrowing at C6-7. He was prescribed ibuprofen 600mg  q6 hr prn pain.  Normocytic Anemia: Hgb on admission 11.4. No previous labs for comparison. MCV normal at 80.9. Iron, TIBC, and Ferritin within normal limits. Reticulocytes normal. TSH wnl. Consider further workup.  2.  Labs / imaging needed at time of follow-up: None  3.  Pending labs/ test needing follow-up: None   Follow-up Appointments: Follow-up Information    Follow up with Primary Care Provider. Schedule an appointment as soon as possible for a visit in 1 week.   Why:  Hospital follow up      Follow up with Call number for [facility].   Why:  See the Health Connect paper that Case Management provided   Contact information:   Assistance from this service will help you find a [facility] provider that will accept your insurance      Discharge Instructions:     Discharge Instructions    Call MD for:  difficulty breathing, headache or visual disturbances    Complete by:  As directed      Call  MD for:  persistant dizziness or light-headedness    Complete by:  As directed      Call MD for:  persistant nausea and vomiting    Complete by:  As directed      Call MD for:  severe uncontrolled pain    Complete by:  As directed      Call MD for:  temperature >100.4    Complete by:  As directed      Increase activity slowly    Complete by:  As directed            Consultations: None  Procedures Performed:  Dg Chest 2 View  09/20/2014   CLINICAL DATA:  Left chest pain.  Shortness of breath.  EXAM: CHEST  2 VIEW  COMPARISON:  None.  FINDINGS: The heart size and mediastinal contours are within normal limits. Both lungs are clear. The visualized skeletal structures are unremarkable.  IMPRESSION: No active cardiopulmonary disease.   Electronically Signed   By: Gaylyn Rong M.D.   On: 09/20/2014 10:56   Dg Cervical Spine Complete  09/20/2014   CLINICAL DATA:  44 year old with left shoulder and arm pain. No acute injury. Initial encounter.  EXAM: CERVICAL SPINE  4+ VIEWS  COMPARISON:  None.  FINDINGS: The prevertebral soft tissues are normal. The alignment is anatomic through T1. There is no evidence of acute fracture or traumatic subluxation. The C1-2 articulation appears  normal in the AP projection. There is mild disc space loss and uncinate spurring in the lower cervical spine, greatest at C6-7. Oblique views demonstrate mild left-sided foraminal narrowing at C6-7.  IMPRESSION: No acute osseous findings or malalignment. Mild spondylosis with mild left-sided foraminal narrowing at C6-7.   Electronically Signed   By: Carey BullocksWilliam  Veazey M.D.   On: 09/20/2014 14:46   Ct Head Wo Contrast  09/20/2014   CLINICAL DATA:  Headache with left arm numbness today. Initial encounter.  EXAM: CT HEAD WITHOUT CONTRAST  TECHNIQUE: Contiguous axial images were obtained from the base of the skull through the vertex without intravenous contrast.  COMPARISON:  None.  FINDINGS: There is no evidence of acute  intracranial hemorrhage, mass lesion, brain edema or extra-axial fluid collection. The ventricles and subarachnoid spaces are appropriately sized for age. There is no CT evidence of acute cortical infarction. There is low-density within the sella turcica, suggesting possible partially empty sella.  There is a mucous retention cyst in the left maxillary sinus. The visualized paranasal sinuses, mastoid air cells and middle ears are otherwise clear. The calvarium is intact.  IMPRESSION: No acute intracranial findings. Possible partially empty sella and left maxillary sinus mucous retention cyst.   Electronically Signed   By: Carey BullocksWilliam  Veazey M.D.   On: 09/20/2014 15:04    Admission HPI: Mr. Timoteo Gaulemus Clucas is a 44 year old man with history of hypertension presenting with L shoulder and chest pain. The pain started today while he was walking at work outside. He noticed a constant, cramping pain the back of his L shoulder. He then had numbness going down to his L hand. He felt lightheaded. He has lightheadedness with standing. The pain radiated up the L side of his neck. He had chest tightness in L lower chest that comes and goes. Resting makes the pain better and walking makes the pain worse. Deep breaths also make the pain worse. No change with leaning forward. He had associated diaphoresis and nausea. He denies emesis or shortness of breath. He reports L leg tingling. Denies fevers, chills, vision changes, cough, abdominal pain, diarrhea, hematochezia, melena, dysuria, hematuria, rash, weakness. Denies history of VTE.   Hospital Course by problem list:   1. Chest pain: Pain is reproducible on palpation making MSK etiology possible. CXR with no acute cardiopulmonary process. Initial troponin negative and EKG without acute ischemic changes. He received ASA 325mg  x 1. He was admitted for further evaluation. UDS unremarkable. Troponins negative x 3. Repeat EKG unremarkable. Total cholesterol 166, HDL 56 and LDL 98.  Hgb A1c 5.2%. 3.7% 10-year risk of heart disease or stroke. At discharge, he had no further episodes of chest pain. If he has recurrent chest pain, may consider stress testing.   2. Cervical radiculopathy: CT head with no acute intracranial findings. XR cervical spine With no acute osseous findings or malalignment. Mild spondylosis with mild left-sided foraminal narrowing at C6-7. He was prescribed ibuprofen 600mg  q6 hr prn pain.  3. Normocytic Anemia: Hgb on admission 11.4. No previous labs for comparison. MCV normal at 80.9. Iron, TIBC, and Ferritin within normal limits. Reticulocytes normal. TSH wnl. Will have him follow up with PCP.   Discharge Vitals:   BP 126/85 mmHg  Pulse 62  Temp(Src) 98.2 F (36.8 C) (Oral)  Resp 18  Ht 6' (1.829 m)  Wt 204 lb 8 oz (92.761 kg)  BMI 27.73 kg/m2  SpO2 100%  Discharge Labs:  Results for orders placed or performed during the hospital encounter  of 09/20/14 (from the past 24 hour(s))  Urinalysis, Routine w reflex microscopic (not at [facility])     Status: None   Collection Time: 09/20/14 11:15 AM  Result Value Ref Range   Color, Urine YELLOW YELLOW   APPearance CLEAR CLEAR   Specific Gravity, Urine 1.016 1.005 - 1.030   pH 6.5 5.0 - 8.0   Glucose, UA NEGATIVE NEGATIVE mg/dL   Hgb urine dipstick NEGATIVE NEGATIVE   Bilirubin Urine NEGATIVE NEGATIVE   Ketones, ur NEGATIVE NEGATIVE mg/dL   Protein, ur NEGATIVE NEGATIVE mg/dL   Urobilinogen, UA 0.2 0.0 - 1.0 mg/dL   Nitrite NEGATIVE NEGATIVE   Leukocytes, UA NEGATIVE NEGATIVE  Urine rapid drug screen (hosp performed)     Status: Abnormal   Collection Time: 09/20/14 11:15 AM  Result Value Ref Range   Opiates POSITIVE (A) NONE DETECTED   Cocaine NONE DETECTED NONE DETECTED   Benzodiazepines NONE DETECTED NONE DETECTED   Amphetamines NONE DETECTED NONE DETECTED   Tetrahydrocannabinol NONE DETECTED NONE DETECTED   Barbiturates NONE DETECTED NONE DETECTED  Hemoglobin A1c     Status: None    Collection Time: 09/20/14  3:44 PM  Result Value Ref Range   Hgb A1c MFr Bld 5.2 4.8 - 5.6 %   Mean Plasma Glucose 103 mg/dL  Troponin I     Status: None   Collection Time: 09/20/14  3:44 PM  Result Value Ref Range   Troponin I <0.03 <0.031 ng/mL  Lipid panel     Status: None   Collection Time: 09/20/14  3:44 PM  Result Value Ref Range   Cholesterol 166 0 - 200 mg/dL   Triglycerides 58 <161 mg/dL   HDL 56 >09 mg/dL   Total CHOL/HDL Ratio 3.0 RATIO   VLDL 12 0 - 40 mg/dL   LDL Cholesterol 98 0 - 99 mg/dL  Reticulocytes     Status: None   Collection Time: 09/20/14  3:44 PM  Result Value Ref Range   Retic Ct Pct 1.1 0.4 - 3.1 %   RBC. 5.08 4.22 - 5.81 MIL/uL   Retic Count, Manual 55.9 19.0 - 186.0 K/uL  Iron and TIBC     Status: None   Collection Time: 09/20/14  6:07 PM  Result Value Ref Range   Iron 60 45 - 182 ug/dL   TIBC 604 540 - 981 ug/dL   Saturation Ratios 20 17.9 - 39.5 %   UIBC 244 ug/dL  Ferritin     Status: None   Collection Time: 09/20/14  6:07 PM  Result Value Ref Range   Ferritin 57 24 - 336 ng/mL  Troponin I     Status: None   Collection Time: 09/21/14  2:50 AM  Result Value Ref Range   Troponin I <0.03 <0.031 ng/mL  TSH     Status: None   Collection Time: 09/21/14  4:00 AM  Result Value Ref Range   TSH 2.166 0.350 - 4.500 uIU/mL    Signed: Lora Paula, MD 09/21/2014, 11:06 AM    Services Ordered on Discharge: None Equipment Ordered on Discharge: None

## 2016-03-10 HISTORY — PX: KNEE ARTHROSCOPY: SUR90

## 2016-06-21 IMAGING — CR DG CHEST 2V
2 series · 2 of 2 positions shown · non-contrast
Comparison: None.

CLINICAL DATA: Left chest pain.  Shortness of breath.

EXAM:
CHEST  2 VIEW

[chest pa]
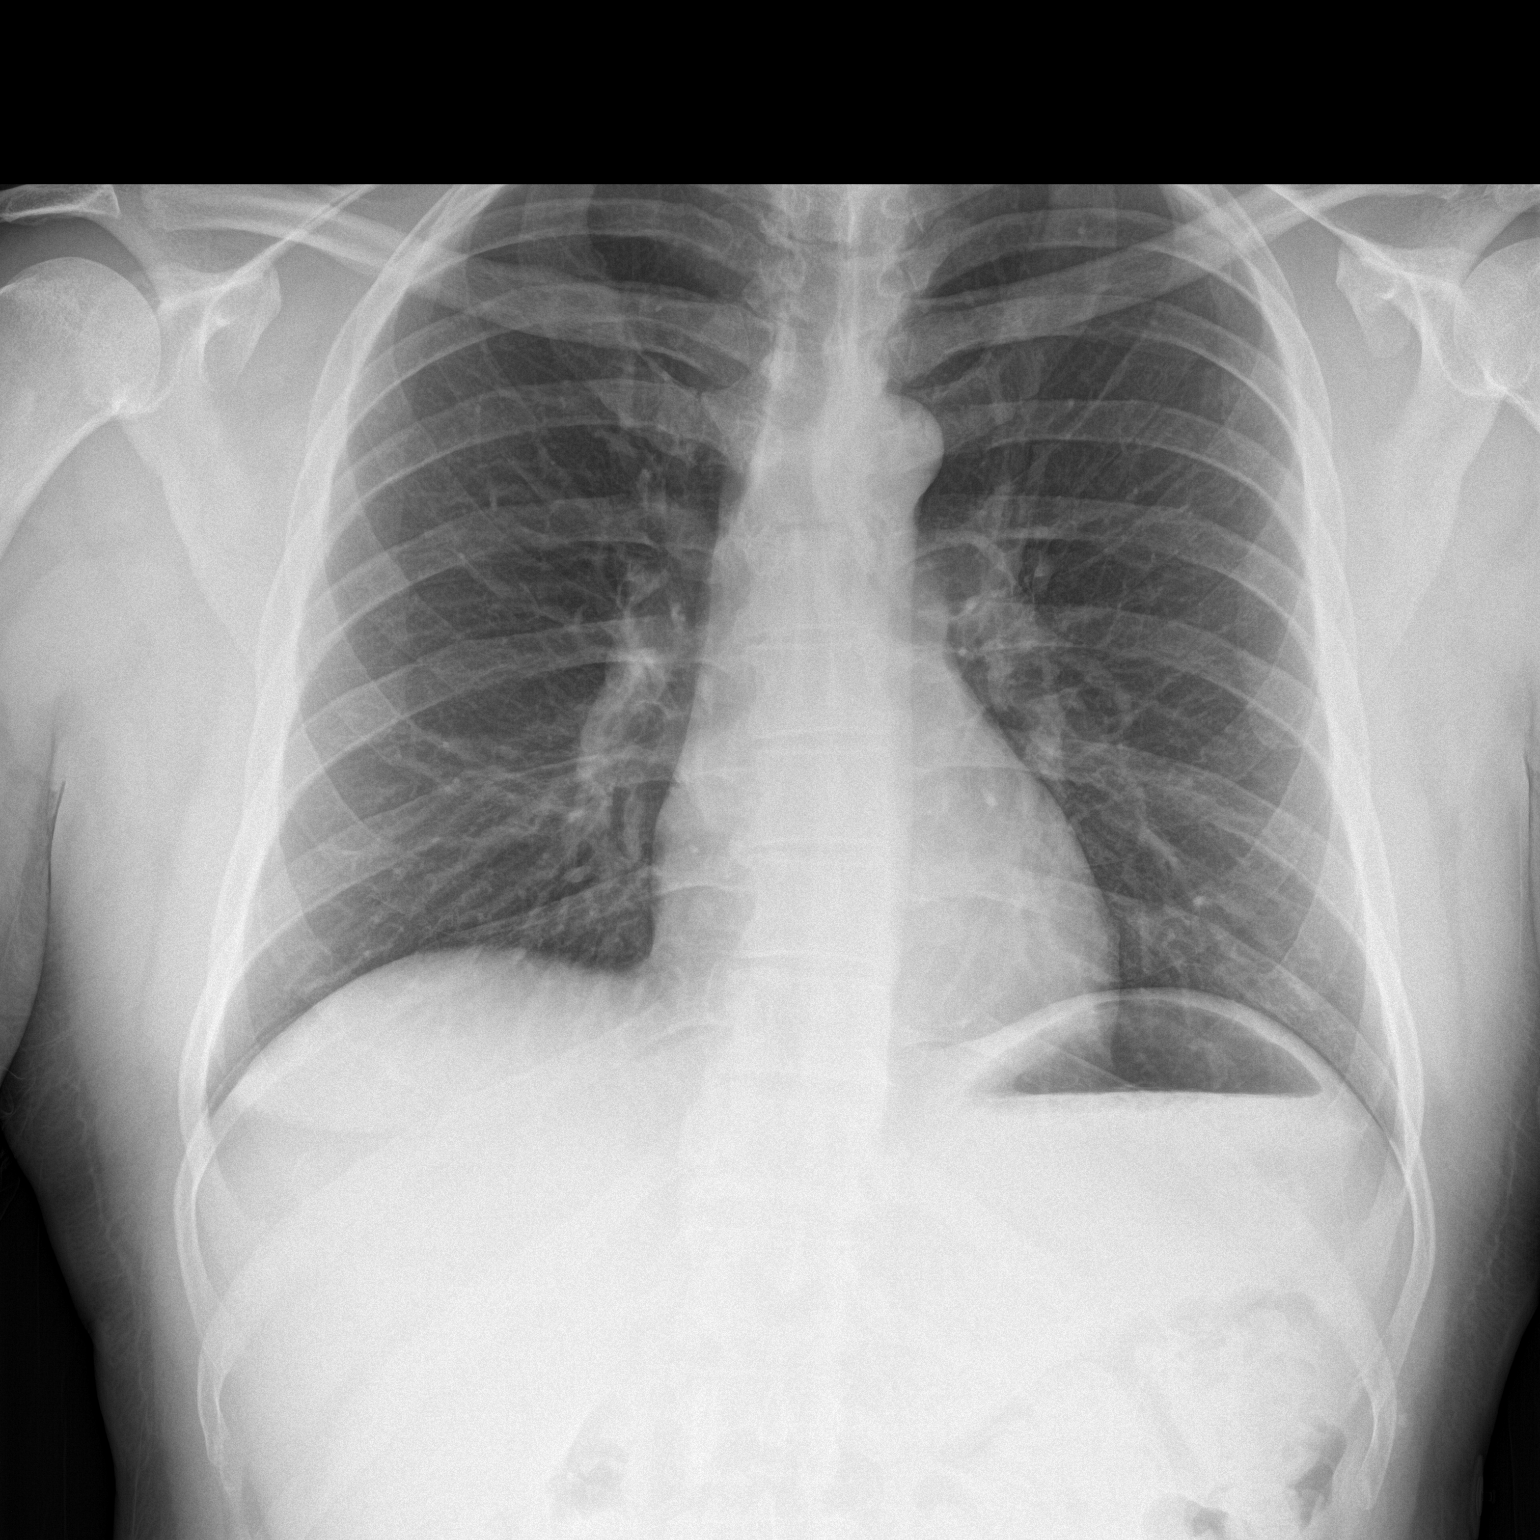

[chest lat]
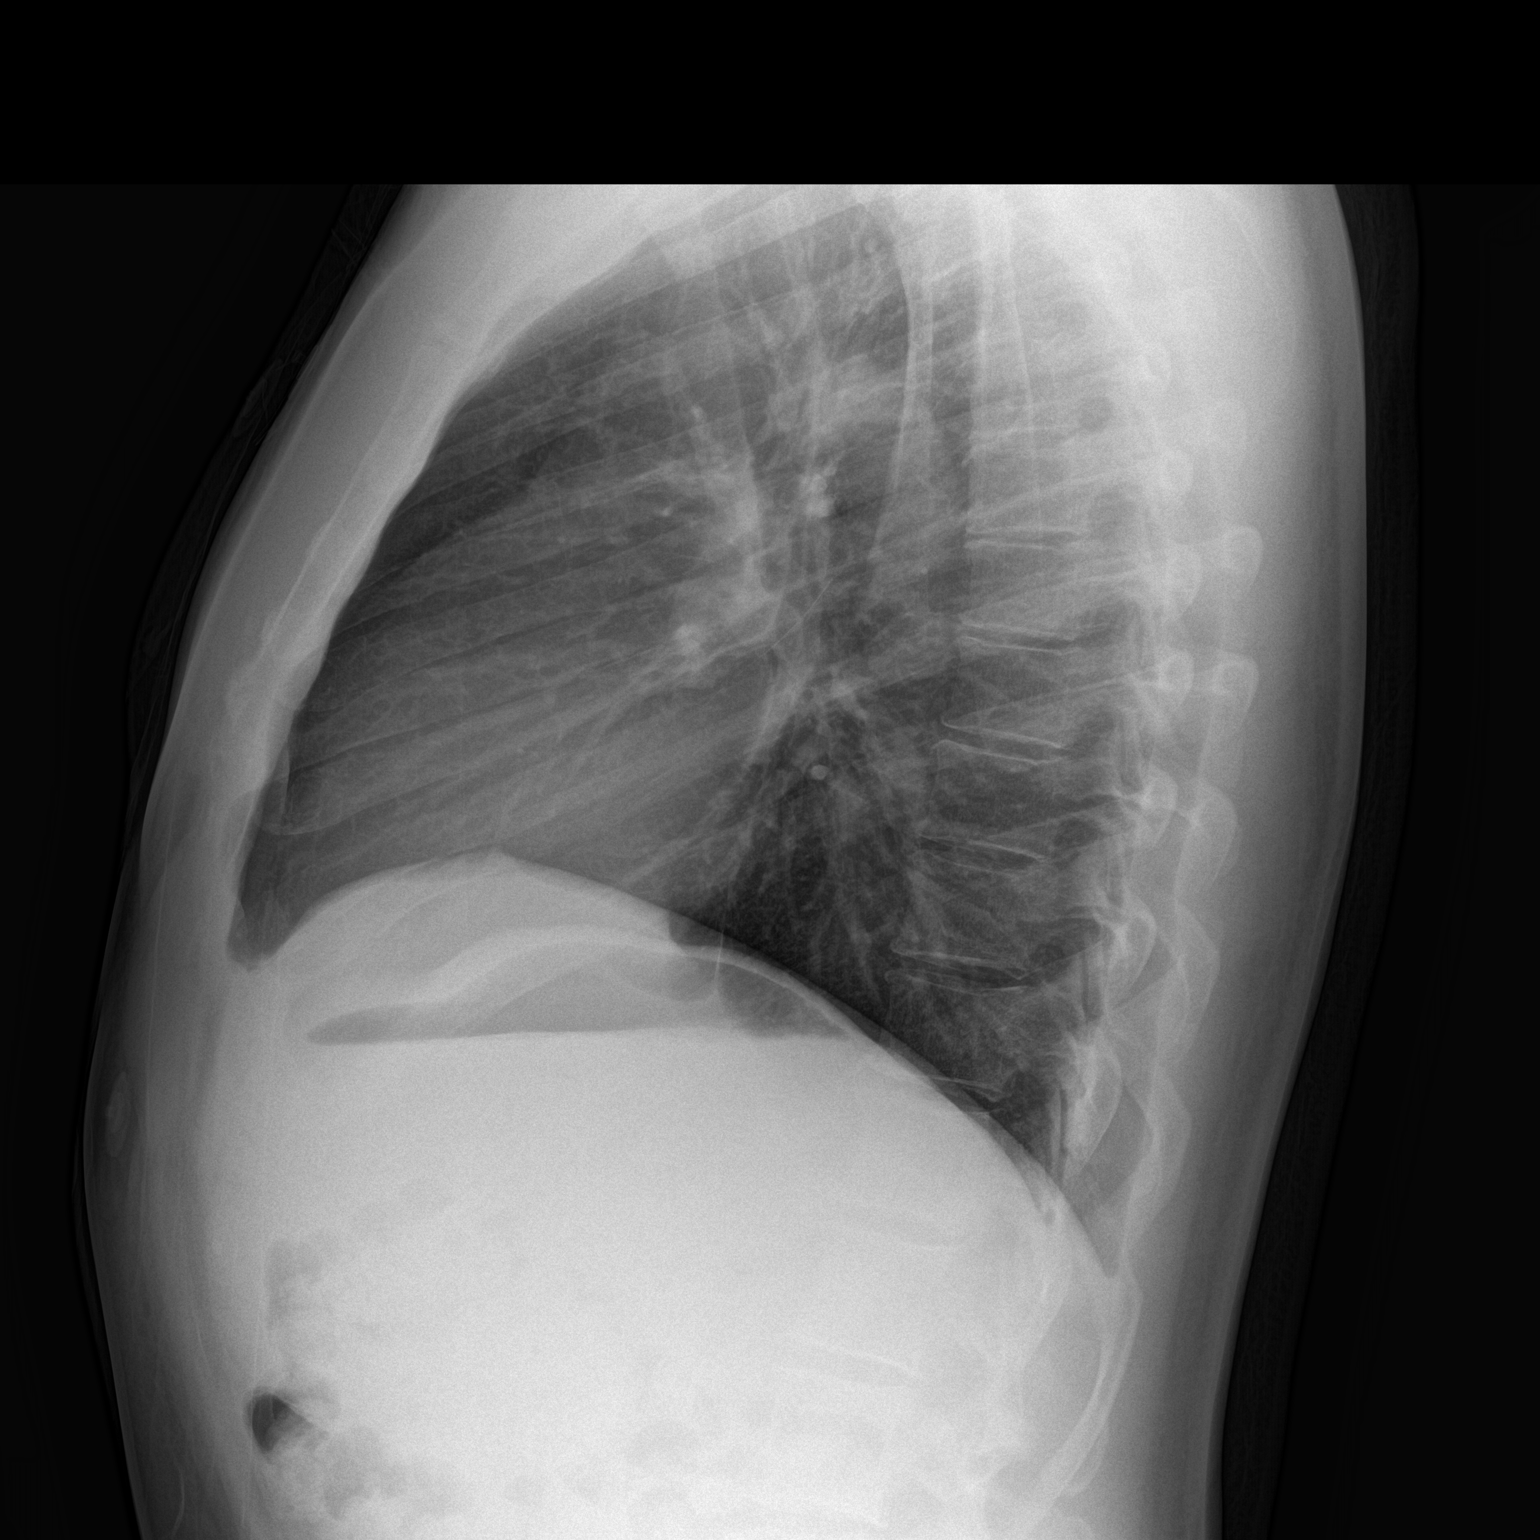

[2 of 2 positions shown; findings below may reference images not displayed]

FINDINGS: The heart size and mediastinal contours are within normal limits.
Both lungs are clear. The visualized skeletal structures are
unremarkable.
IMPRESSION: No active cardiopulmonary disease.

## 2017-04-03 ENCOUNTER — Other Ambulatory Visit: Payer: Self-pay | Admitting: Physician Assistant

## 2017-04-03 DIAGNOSIS — M5136 Other intervertebral disc degeneration, lumbar region: Secondary | ICD-10-CM

## 2017-04-13 ENCOUNTER — Other Ambulatory Visit: Payer: Self-pay | Admitting: Physician Assistant

## 2017-04-13 ENCOUNTER — Ambulatory Visit
Admission: RE | Admit: 2017-04-13 | Discharge: 2017-04-13 | Disposition: A | Discharge status: Home / Self Care | Payer: BLUE CROSS/BLUE SHIELD | Source: Ambulatory Visit | Attending: Physician Assistant | Admitting: Physician Assistant

## 2017-04-13 DIAGNOSIS — M5136 Other intervertebral disc degeneration, lumbar region: Secondary | ICD-10-CM

## 2017-04-13 MED ORDER — DIAZEPAM 5 MG PO TABS
10.0000 mg | ORAL_TABLET | Freq: Once | ORAL | Status: AC
  Administered 2017-04-13: 10 mg via ORAL

## 2017-04-13 NOTE — Discharge Instructions (Signed)

## 2017-04-16 ENCOUNTER — Other Ambulatory Visit: Payer: Self-pay | Admitting: *Deleted

## 2017-04-20 ENCOUNTER — Telehealth: Payer: Self-pay | Admitting: Vascular Surgery

## 2017-04-20 NOTE — Telephone Encounter (Signed)
Sched appt 04/21/17 at 9:30. Spoke to pt to confirm appt.

## 2017-04-20 NOTE — Telephone Encounter (Signed)
-----   Message from Retta Macebecca J Roberts, RN sent at 04/16/2017 12:06 PM EST ----- Regarding: Need office appointment  ALIF patient surgery date is 05/13/17 need office appt. With Dr. Arbie CookeyEarly. Please make and call patient. Thank you, B

## 2017-04-21 ENCOUNTER — Encounter: Payer: Self-pay | Admitting: Vascular Surgery

## 2017-04-21 ENCOUNTER — Other Ambulatory Visit: Payer: Self-pay

## 2017-04-21 ENCOUNTER — Ambulatory Visit (INDEPENDENT_AMBULATORY_CARE_PROVIDER_SITE_OTHER): Payer: BLUE CROSS/BLUE SHIELD | Admitting: Vascular Surgery

## 2017-04-21 VITALS — BP 118/86 | HR 87 | Temp 98.2°F | Resp 18 | Ht 72.0 in | Wt 253.0 lb

## 2017-04-21 DIAGNOSIS — M5137 Other intervertebral disc degeneration, lumbosacral region: Secondary | ICD-10-CM | POA: Diagnosis not present

## 2017-04-21 NOTE — Progress Notes (Signed)
Vascular and Vein Specialist of [facility]  Patient name: Jonathan Everett MRN: [id number] DOB: 1970/09/20 Sex: male  REASON FOR CONSULT: Discuss anterior exposure for L4-5 and L5-S1 disc surgery  HPI: Jonathan Everett is a 47 y.o. male, who is here today for discussion of anterior exposure.  He reports several year history of increasingly severe low back pain.  He reports this extends into both legs more so on the right than on the left.  He feels a heat and aching sensation throughout his legs.  Also reports severe discomfort in both hips when this is occurring.  He is can failed conservative treatment has been recommended that he undergo spine surgery with an anterior approach by Dr. Shon Baton.  He is here today dated discussion my role for exposure.  He has had prior appendectomy through a laparoscopic approach through the umbilicus.  No other intra-abdominal surgery.  No history of cardiac disease or peripheral vascular occlusive disease.  He is obese but reports that this is been since his inability to exercise due to his back.  Past Medical History:  Diagnosis Date  . Arthritis    "back" (09/20/2014)  . Chronic lower back pain   . Family history of adverse reaction to anesthesia    "daughter turns into a ball of mush" (09/20/2014)  . Headache    "monthly" (09/20/2014)  . Hypertension   . Seizures (HCC) 2014 X 1; 2015 X 1   "my potassium was real low"     History reviewed. No pertinent family history.  SOCIAL HISTORY: Social History   Socioeconomic History  . Marital status: Married    Spouse name: Not on file  . Number of children: Not on file  . Years of education: Not on file  . Highest education level: Not on file  Social Needs  . Financial resource strain: Not on file  . Food insecurity - worry: Not on file  . Food insecurity - inability: Not on file  . Transportation needs - medical: Not on file  . Transportation needs - non-medical: Not on  file  Occupational History  . Not on file  Tobacco Use  . Smoking status: Never Smoker  . Smokeless tobacco: Former Neurosurgeon  . Tobacco comment: "quit chewing tobacco in ~ 1989"  Substance and Sexual Activity  . Alcohol use: Yes    Comment: 09/20/2014 "glass of wine w/steak q other month or so"  . Drug use: No  . Sexual activity: Yes  Other Topics Concern  . Not on file  Social History Narrative  . Not on file    No Known Allergies  Current Outpatient Medications  Medication Sig Dispense Refill  . hydrochlorothiazide (HYDRODIURIL) 25 MG tablet     . ibuprofen (ADVIL,MOTRIN) 600 MG tablet Take 1 tablet (600 mg total) by mouth every 6 (six) hours as needed for headache or moderate pain. 30 tablet 0  . lisinopril (PRINIVIL,ZESTRIL) 20 MG tablet lisinopril 20 mg tablet     No current facility-administered medications for this visit.     REVIEW OF SYSTEMS:  [X]  denotes positive finding, [ ]  denotes negative finding Cardiac  Comments:  Chest pain or chest pressure:    Shortness of breath upon exertion:    Short of breath when lying flat:    Irregular heart rhythm:        Vascular    Pain in calf, thigh, or hip brought on by ambulation: x  neurogenic  Pain in feet at night that wakes  you up from your sleep:  x  neurogenic  Blood clot in your veins:    Leg swelling:         Pulmonary    Oxygen at home:    Productive cough:     Wheezing:         Neurologic    Sudden weakness in arms or legs:  x   Sudden numbness in arms or legs:  x   Sudden onset of difficulty speaking or slurred speech:    Temporary loss of vision in one eye:     Problems with dizziness:         Gastrointestinal    Blood in stool:     Vomited blood:         Genitourinary    Burning when urinating:     Blood in urine:        Psychiatric    Major depression:         Hematologic    Bleeding problems:    Problems with blood clotting too easily:        Skin    Rashes or ulcers:          Constitutional    Fever or chills:      PHYSICAL EXAM: Vitals:   04/21/17 0942  BP: 118/86  Pulse: 87  Resp: 18  Temp: 98.2 F (36.8 C)  TempSrc: Oral  SpO2: 96%  Weight: 253 lb (114.8 kg)  Height: 6' (1.829 m)    GENERAL: The patient is a well-nourished male, in no acute distress. The vital signs are documented above. CARDIOVASCULAR: Carotid arteries without bruits bilaterally.  Heart regular rate and rhythm.  Radial and femoral pulses 2+ bilaterally. PULMONARY: There is good air exchange  ABDOMEN: Soft and non-tender.  No bruits and no masses noted.)  An umbilical incision present.  No other masses MUSCULOSKELETAL: There are no major deformities or cyanosis. NEUROLOGIC: No focal weakness or paresthesias are detected. SKIN: There are no ulcers or rashes noted. PSYCHIATRIC: The patient has a normal affect.  DATA:  Lumbar discogram from 04/13/2017 was reviewed.  This shows no calcification of aortoiliac segments  MEDICAL ISSUES: Discussion with the patient regarding my role for anterior.  Explained mobilization of the rectus muscle, intraperitoneal contents, left ureter and arterial venous structures overlying the spine explained mobilization of arterial venous structures to the right for L4-5 exposure and between the level of the iliac vessels for L5-S1 exposure.  Explained the potential injury to all of these structures including the potential for significant venous bleeding.  So explained the potential for retrograde ejaculation.  Patient understands the procedure and wishes to proceed as scheduled for 05/13/2017.   Jonathan Earthlyodd F. Early, MD FACS [facility] Office Tel [phone] Pager [phone]

## 2017-05-04 NOTE — Pre-Procedure Instructions (Signed)
Conlin Cutbirth  05/04/2017      [id number] [place], [place] - [address] [place] [id number] Phone: [phone] Fax: [phone]    Your procedure is scheduled on May 13, 2017.  [facility] Admitting at 06:30 A.M.  Call this number if you have problems the morning of surgery:  [phone]   Remember:  Do not eat food or drink liquids after midnight.  Take these medicines the morning of surgery with A SIP OF WATER :  Esomeprazole (Nexium) if needed Flonase nasal spray if needed  7 days prior to surgery STOP taking any Aspirin (unless otherwise instructed by your surgeon), Aleve, Naproxen, Ibuprofen, Motrin, Advil, Goody's, BC's, all herbal medications, fish oil, and all vitamins.   Do not wear jewelry.  Do not wear lotions, powders, or perfumes, or deodorant.  Do not shave 48 hours prior to surgery.  Men may shave face and neck.  Do not bring valuables to the hospital.  [facility] is not responsible for any belongings or valuables.  Contacts, dentures or bridgework may not be worn into surgery.  Leave your suitcase in the car.  After surgery it may be brought to your room.  For patients admitted to the hospital, discharge time will be determined by your treatment team.  Patients discharged the day of surgery will not be allowed to drive home.   Special instructions:   [place]- Preparing For Surgery  Before surgery, you can play an important role. Because skin is not sterile, your skin needs to be as free of germs as possible. You can reduce the number of germs on your skin by washing with CHG (chlorahexidine gluconate) Soap before surgery.  CHG is an antiseptic cleaner which kills germs and bonds with the skin to continue killing germs even after washing.  Please do not use if you have an allergy to CHG or antibacterial soaps. If your skin becomes reddened/irritated stop  using the CHG.  Do not shave (including legs and underarms) for at least 48 hours prior to first CHG shower. It is OK to shave your face.  Please follow these instructions carefully.   1. Shower the NIGHT BEFORE SURGERY and the MORNING OF SURGERY with CHG.   2. If you chose to wash your hair, wash your hair first as usual with your normal shampoo.  3. After you shampoo, rinse your hair and body thoroughly to remove the shampoo.  4. Use CHG as you would any other liquid soap. You can apply CHG directly to the skin and wash gently with a scrungie or a clean washcloth.   5. Apply the CHG Soap to your body ONLY FROM THE NECK DOWN.  Do not use on open wounds or open sores. Avoid contact with your eyes, ears, mouth and genitals (private parts). Wash Face and genitals (private parts)  with your normal soap.  6. Wash thoroughly, paying special attention to the area where your surgery will be performed.  7. Thoroughly rinse your body with warm water from the neck down.  8. DO NOT shower/wash with your normal soap after using and rinsing off the CHG Soap.  9. Pat yourself dry with a CLEAN TOWEL.  10. Wear CLEAN PAJAMAS to bed the night before surgery, wear comfortable clothes the morning of surgery  11. Place CLEAN SHEETS on your bed the night of your first shower and DO  NOT SLEEP WITH PETS.    Day of Surgery: Do not apply any deodorants/lotions. Please wear clean clothes to the hospital/surgery center.      Please read over the following fact sheets that you were given.

## 2017-05-05 ENCOUNTER — Encounter (HOSPITAL_COMMUNITY)
Admission: RE | Admit: 2017-05-05 | Discharge: 2017-05-05 | Disposition: A | Discharge status: Home / Self Care | Payer: BLUE CROSS/BLUE SHIELD | Source: Ambulatory Visit | Attending: Orthopedic Surgery | Admitting: Orthopedic Surgery

## 2017-05-05 ENCOUNTER — Encounter (HOSPITAL_COMMUNITY): Payer: Self-pay

## 2017-05-05 ENCOUNTER — Other Ambulatory Visit: Payer: Self-pay

## 2017-05-05 DIAGNOSIS — Z79899 Other long term (current) drug therapy: Secondary | ICD-10-CM | POA: Insufficient documentation

## 2017-05-05 DIAGNOSIS — M1990 Unspecified osteoarthritis, unspecified site: Secondary | ICD-10-CM | POA: Diagnosis not present

## 2017-05-05 DIAGNOSIS — Z01818 Encounter for other preprocedural examination: Secondary | ICD-10-CM | POA: Diagnosis present

## 2017-05-05 DIAGNOSIS — I10 Essential (primary) hypertension: Secondary | ICD-10-CM | POA: Insufficient documentation

## 2017-05-05 DIAGNOSIS — Z9889 Other specified postprocedural states: Secondary | ICD-10-CM | POA: Insufficient documentation

## 2017-05-05 DIAGNOSIS — K219 Gastro-esophageal reflux disease without esophagitis: Secondary | ICD-10-CM | POA: Diagnosis not present

## 2017-05-05 HISTORY — DX: Gastro-esophageal reflux disease without esophagitis: K21.9

## 2017-05-05 LAB — CBC
HEMATOCRIT: 44.1 % (ref 39.0–52.0)
Hemoglobin: 14.6 g/dL (ref 13.0–17.0)
MCH: 26.6 pg (ref 26.0–34.0)
MCHC: 33.1 g/dL (ref 30.0–36.0)
MCV: 80.3 fL (ref 78.0–100.0)
PLATELETS: 249 10*3/uL (ref 150–400)
RBC: 5.49 MIL/uL (ref 4.22–5.81)
RDW: 13.9 % (ref 11.5–15.5)
WBC: 11.9 10*3/uL — AB (ref 4.0–10.5)

## 2017-05-05 LAB — BASIC METABOLIC PANEL
ANION GAP: 9 (ref 5–15)
BUN: 13 mg/dL (ref 6–20)
CO2: 22 mmol/L (ref 22–32)
Calcium: 9.2 mg/dL (ref 8.9–10.3)
Chloride: 106 mmol/L (ref 101–111)
Creatinine, Ser: 0.94 mg/dL (ref 0.61–1.24)
GFR calc Af Amer: >60 mL/min (ref >60)
Glucose, Bld: 87 mg/dL (ref 65–99)
POTASSIUM: 3.6 mmol/L (ref 3.5–5.1)
SODIUM: 137 mmol/L (ref 135–145)

## 2017-05-05 LAB — TYPE AND SCREEN
ABO/RH(D): O POS
ANTIBODY SCREEN: NEGATIVE

## 2017-05-05 LAB — ABO/RH: ABO/RH(D): O POS

## 2017-05-05 LAB — SURGICAL PCR SCREEN
MRSA, PCR: NEGATIVE
STAPHYLOCOCCUS AUREUS: NEGATIVE

## 2017-05-05 NOTE — Progress Notes (Signed)
PCP: [facility]--medical clearance in chart Cardiologist: Denies  EKG: Requested image from [facility], had for pre-op clearance Feb 2019 E[facility]: [facility]ed from [facility], can't remember if he had one done, possibly in 1996 related to HTN diagnosis Stress Test: [facility], can't remember if he had one done, possibly in 1996 related to HTN diagnosis Cardiac Cath: Denies  Patient denies shortness of breath, fever, cough, and chest pain at PAT appointment.  Patient verbalized understanding of instructions provided today at the PAT appointment.  Patient asked to review instructions at home and day of surgery.

## 2017-05-06 NOTE — Progress Notes (Addendum)
Anesthesia Chart Review: Patient is a 47 year old male scheduled for ALIF L4-S1 on 05/13/17 by Dr. Venita Lickahari Brooks. Anterior exposure by Dr. Gretta Beganodd Early.   History includes never smoker, HTN, arthritis, "seizure" (reportedly related to severe hypokalemia ~ 2014/2015), GERD, intussusception of small intestine (s/p diagnostic laparoscopy/LOA/incidental appendectomy 07/17/14), right knee arthroscopy 06/13/16, right rotator cuff repair '10.   He reported a prior stress and echo in 1996 [facility] done as part of evaluation for HTN. I also found a 2011 stress in Care Everywhere that was normal.  [facility] is with [facility]. Medical clearance letter signed by Karen Kitchensavid Holton, NP.  Meds include Nexium, Flonase, HCTZ, lisinopril.  BP 136/90   Pulse 93   Temp 36.7 C   Resp 18   Ht 5\' 11"  (1.803 m)   Wt 247 lb (112 kg)   SpO2 97%   BMI 34.45 kg/m   EKG 04/17/17 [facility]): Requested. By notes, "LAE and non specific st-t wave changes."  Nuclear stress test 02/11/10 [facility]): IMPRESSION: 1. Clinical hemodynamic and electrocardiographic data reported elsewhere. 2. No SPECT evidence of myocardial ischemia or infarction. 3. Normal LV systolic function.  Preoperative labs noted. BMET WNL. WBC 11.9, H/H 14.6/44.1, PLT 249. Glucose 87. T&S done.   EKG tracing has not yet been received from [facility], so chart will be left for follow-up. He was medically cleared at that same visit, so based on currently available information I am anticipating that he can proceed if no acute changes. If EKG not received then new tracing would be needed on the day of surgery. (Update 05/08/17 2:23 PM: 04/17/17 EKG received and showed ST at 103 bpm, LAE, non-specific ST/T wave changes. Non-specific T wave abnormality is more apparent in inferior lead III, but overall EKG appears consistent with previous tracing from 09/20/14. Clearance notes states, "He has a home gym and home push ups daily. He walks on  the TM 3 x 's week for 10 min. He can walk 2 flights of stairs without chest pain or shortness of breath." EKG appears stable over past 2 1/2 years. Based on clearance visit, he can tolerate > 4 METS. Based on currently available information I would anticipate that he can proceed as planned if no acute changes. Reviewed as well with anesthesiologist Dr. Achille RichAdam Hodierne.)  Shonna ChockAllison Brent Taillon, PA-C [facility]/Anesthesiology Phone [phone] 05/06/2017 2:46 PM

## 2017-05-08 NOTE — H&P (Addendum)
Patient ID: Jonathan Everett MRN: [id number] DOB/AGE: 48/09/1970 48 y.o.  Admit date: (Not on file)  Admission Diagnoses:  Lumbar Degenerative Disc Disease L4-S1  HPI: The patient is here today for a pre-operative History and Physical. They are scheduled for ALIF L4-S1 with posterior supplemental pedicle screw fixation on 05-13-17 with Dr. Shon [facility]atonBrooks at [facility]. The patient is treated for high blood pressure but no other medical concerns.  Patient has been cleared by his primary care provider for surgical intervention.   .  Past Medical History: Past Medical History:  Diagnosis Date  . Arthritis    "back" (09/20/2014)  . Chronic lower back pain   . Family history of adverse reaction to anesthesia    "daughter turns into a ball of mush" (09/20/2014)  . GERD (gastroesophageal reflux disease)   . Headache    "monthly" (09/20/2014)  . Hypertension   . Seizures (HCC) 2014 X 1; 2015 X 1   "my potassium was real low"     Surgical History: Past Surgical History:  Procedure Laterality Date  . ACHILLES TENDON SURGERY Left 2015  . APPENDECTOMY  2016  . SHOULDER ARTHROSCOPY W/ ROTATOR CUFF REPAIR Right 2010  . WRIST SURGERY Left ?1996   "don't remember what it was for"    Family History: No family history on file.  Social History: Social History   Socioeconomic History  . Marital status: Married    Spouse name: Not on file  . Number of children: Not on file  . Years of education: Not on file  . Highest education level: Not on file  Social Needs  . Financial resource strain: Not on file  . Food insecurity - worry: Not on file  . Food insecurity - inability: Not on file  . Transportation needs - medical: Not on file  . Transportation needs - non-medical: Not on file  Occupational History  . Not on file  Tobacco Use  . Smoking status: Never Smoker  . Smokeless tobacco: Former NeurosurgeonUser  . Tobacco comment: "quit chewing tobacco in ~ 1989"  Substance and Sexual Activity   . Alcohol use: No    Frequency: Never  . Drug use: No  . Sexual activity: Yes  Other Topics Concern  . Not on file  Social History Narrative  . Not on file    Allergies: Patient has no known allergies.  Medications: I have reviewed the patient's current medications.  Vital Signs: No data found.  Radiology: Dg Diskogram Lumbar  Result Date: 04/13/2017 CLINICAL DATA:  Low back pain extending into the right lower extremity. Displacement of the L4-5 and L5 lumbar discs. EXAM: DIAGNOSTIC LUMBAR DISK INJECTIONS FLUOROSCOPY TIME:  Radiation Exposure Index (as provided by the fluoroscopic device): 5.10 uGy*m2 Fluoroscopy Time:  1 minute 0 seconds Number of Acquired Images:  0 PROCEDURE: The procedure was discussed in depth with the patient including the potential risk of infection. The patient received 2 gm amoxicillin by mouth prior to the procedure. 10 mg Valium was provided by mouth prior to the procedure. The patient was placed prone on the fluoroscopic table. A Betadine scrub of the low back was performed and the patient was draped in a sterile fashion. Skin anesthesia was carried out using 1% Lidocaine. 15 cm 22 gauge Chiba needle was directed into the nuclear region of the disk at L4-5. A 20 cm directed into the clear region 22 gauge Chiba needle of the disc at L5-S1. Injected 1.5 mL of 2% lidocaine at  both levels. FINDINGS: The patient had some replication of is typical low back pain at both levels. Symptoms extend into the right lower extremity greater at the L5-S1 level. IMPRESSION: Technically successful anesthetic injection into the nuclear region of the L4-5 and L5-S1 discs via a left posterolateral oblique approach. Electronically Signed   By: Marin Roberts M.D.   On: 04/13/2017 11:54   Dg Diskogram Lumbar  Result Date: 04/13/2017 CLINICAL DATA:  Low back pain extending into the right lower extremity. Displacement of the L4-5 and L5 lumbar discs. EXAM: DIAGNOSTIC LUMBAR DISK  INJECTIONS FLUOROSCOPY TIME:  Radiation Exposure Index (as provided by the fluoroscopic device): 5.10 uGy*m2 Fluoroscopy Time:  1 minute 0 seconds Number of Acquired Images:  0 PROCEDURE: The procedure was discussed in depth with the patient including the potential risk of infection. The patient received 2 gm amoxicillin by mouth prior to the procedure. 10 mg Valium was provided by mouth prior to the procedure. The patient was placed prone on the fluoroscopic table. A Betadine scrub of the low back was performed and the patient was draped in a sterile fashion. Skin anesthesia was carried out using 1% Lidocaine. 15 cm 22 gauge Chiba needle was directed into the nuclear region of the disk at L4-5. A 20 cm directed into the clear region 22 gauge Chiba needle of the disc at L5-S1. Injected 1.5 mL of 2% lidocaine at both levels. FINDINGS: The patient had some replication of is typical low back pain at both levels. Symptoms extend into the right lower extremity greater at the L5-S1 level. IMPRESSION: Technically successful anesthetic injection into the nuclear region of the L4-5 and L5-S1 discs via a left posterolateral oblique approach. Electronically Signed   By: Marin Roberts M.D.   On: 04/13/2017 11:54    Labs: No results for input(s): WBC, RBC, HCT, PLT in the last 72 hours. No results for input(s): NA, K, CL, CO2, BUN, CREATININE, GLUCOSE, CALCIUM in the last 72 hours. No results for input(s): LABPT, INR in the last 72 hours.  Review of Systems: ROS  Physical Exam: There is no height or weight on file to calculate BMI.  Physical Exam  Constitutional: He is oriented to person, place, and time. He appears well-developed and well-nourished.  HENT:  Head: Normocephalic.  Eyes: Pupils are equal, round, and reactive to light.  Neck: Normal range of motion. Neck supple.  Cardiovascular: Normal rate, regular rhythm and normal heart sounds.  Respiratory: Effort normal and breath sounds normal.    GI: Soft. Bowel sounds are normal.  Neurological: He is alert and oriented to person, place, and time.  Skin: Skin is warm and dry.  Psychiatric: He has a normal mood and affect. His behavior is normal. Judgment and thought content normal.   tenderness to palpation of bilateral lumbar paraspinals range of motion flexion extension lateral bending elicits pain.  Radicular pain bilaterally right greater than left gait abnormalities noted on the right is patient is unable to walk on toes or heels without increasing pain.  Assessment and Plan: MRI of the lumbar spine: completed on April 07, 2017 was reviewed with the patient.  I have also reviewed the radiology report.  Demonstrates mild left lateral recess stenosis severe right foraminal stenosis with degenerative disc disease at both levels.  Lumbar x-rays from Jul 15, 2016 demonstrate her than 50% loss of disc space height at the L4-5, and L5-S1 levels when compared to the L3-4 level.  There is some evidence of facet arthrosis  on the x-rays as well.  No spondylolisthesis or significant scoliosis.  Intradiscal injection completed on April 13, 2017: Successful intradiscal injection is noted.  Patient had relief of back pain with the injection at the L4-5, and L5-S1 disc spaces.  Also notes that after the L5-S1 injection he had relief of his right neuropathic leg pain.  Lumbar x-rays taken today in the office 3 view.  Continue to demonstrate the collapse of the L4-5 and L5-S1 disc spaces.  Posterior aspect of the disc space shows direct contact of the endplates consistent with the foraminal stenosis seen on MRI.    Risks, benefits of surgery were reviewed with the patient. These include: infection, bleeding, death, stroke, paralysis, ongoing or worse pain, need for additional surgery, injury to the lumbar plexus resulting in hip flexor weakness and difficulty walking without assistive devices. Adjacent segment degenerative disease, need for additional  surgery including fusing other levels, leak of spinal fluid, Nonunion, hardware failure, breakage, or mal-position. Deep venous thrombosis (DVT) requiring additional treatment such as filter, and/or medications. Injury to abdominal contents, loss in bowel and bladder control.  Goal of surgery is to reduce (not eliminate) pain, and improve quality of life.   Anette Riedel, PAC for Venita Lick, MD [facility] [phone]  Surgery was reviewed again with the patient.  Unfortunately the consent was incorrect.  I told the patient that as we spoke of in the office we will be doing an anterior posterior fusion.  The role of the posterior supplemental fixation is to improve the overall fusion rate.  I amended the consent and the patient initialized the change.  I again reviewed all of the risks and benefits and alternatives to surgery with the patient and his wife and all their questions were encouraged and addressed.  Plan on moving forward with the anterior lumbar interbody fusion L4-S1 with posterior supplemental pedicle screw fixation L4-S1.

## 2017-05-13 ENCOUNTER — Inpatient Hospital Stay (HOSPITAL_COMMUNITY): Payer: BLUE CROSS/BLUE SHIELD

## 2017-05-13 ENCOUNTER — Encounter (HOSPITAL_COMMUNITY)
Admission: RE | Disposition: A | Discharge status: Home / Self Care | Payer: Self-pay | Source: Ambulatory Visit | Attending: Orthopedic Surgery

## 2017-05-13 ENCOUNTER — Inpatient Hospital Stay (HOSPITAL_COMMUNITY)
Admission: RE | Admit: 2017-05-13 | Discharge: 2017-05-15 | DRG: 460 | Disposition: A | Discharge status: Home / Self Care | Payer: BLUE CROSS/BLUE SHIELD | Source: Ambulatory Visit | Attending: Orthopedic Surgery | Admitting: Orthopedic Surgery

## 2017-05-13 ENCOUNTER — Encounter (HOSPITAL_COMMUNITY): Payer: Self-pay | Admitting: Anesthesiology

## 2017-05-13 ENCOUNTER — Inpatient Hospital Stay (HOSPITAL_COMMUNITY): Payer: BLUE CROSS/BLUE SHIELD | Admitting: Anesthesiology

## 2017-05-13 ENCOUNTER — Inpatient Hospital Stay (HOSPITAL_COMMUNITY): Payer: BLUE CROSS/BLUE SHIELD | Admitting: Vascular Surgery

## 2017-05-13 DIAGNOSIS — G8929 Other chronic pain: Secondary | ICD-10-CM | POA: Diagnosis present

## 2017-05-13 DIAGNOSIS — Z419 Encounter for procedure for purposes other than remedying health state, unspecified: Secondary | ICD-10-CM

## 2017-05-13 DIAGNOSIS — M47816 Spondylosis without myelopathy or radiculopathy, lumbar region: Secondary | ICD-10-CM | POA: Diagnosis present

## 2017-05-13 DIAGNOSIS — Z87891 Personal history of nicotine dependence: Secondary | ICD-10-CM | POA: Diagnosis not present

## 2017-05-13 DIAGNOSIS — M47817 Spondylosis without myelopathy or radiculopathy, lumbosacral region: Secondary | ICD-10-CM | POA: Diagnosis present

## 2017-05-13 DIAGNOSIS — K219 Gastro-esophageal reflux disease without esophagitis: Secondary | ICD-10-CM | POA: Diagnosis present

## 2017-05-13 DIAGNOSIS — M5137 Other intervertebral disc degeneration, lumbosacral region: Secondary | ICD-10-CM | POA: Diagnosis present

## 2017-05-13 DIAGNOSIS — M4807 Spinal stenosis, lumbosacral region: Secondary | ICD-10-CM | POA: Diagnosis present

## 2017-05-13 DIAGNOSIS — Z9889 Other specified postprocedural states: Secondary | ICD-10-CM | POA: Diagnosis not present

## 2017-05-13 DIAGNOSIS — M549 Dorsalgia, unspecified: Secondary | ICD-10-CM | POA: Diagnosis present

## 2017-05-13 DIAGNOSIS — M2578 Osteophyte, vertebrae: Secondary | ICD-10-CM | POA: Diagnosis present

## 2017-05-13 DIAGNOSIS — M48061 Spinal stenosis, lumbar region without neurogenic claudication: Secondary | ICD-10-CM | POA: Diagnosis present

## 2017-05-13 DIAGNOSIS — M5136 Other intervertebral disc degeneration, lumbar region: Principal | ICD-10-CM | POA: Diagnosis present

## 2017-05-13 DIAGNOSIS — I10 Essential (primary) hypertension: Secondary | ICD-10-CM | POA: Diagnosis present

## 2017-05-13 HISTORY — PX: ANTERIOR LUMBAR FUSION: SHX1170

## 2017-05-13 HISTORY — PX: ABDOMINAL EXPOSURE: SHX5708

## 2017-05-13 LAB — CBC
HCT: 39.8 % (ref 39.0–52.0)
Hemoglobin: 12.8 g/dL — ABNORMAL LOW (ref 13.0–17.0)
MCH: 26.4 pg (ref 26.0–34.0)
MCHC: 32.2 g/dL (ref 30.0–36.0)
MCV: 82.2 fL (ref 78.0–100.0)
PLATELETS: 246 10*3/uL (ref 150–400)
RBC: 4.84 MIL/uL (ref 4.22–5.81)
RDW: 14.1 % (ref 11.5–15.5)
WBC: 14.7 10*3/uL — AB (ref 4.0–10.5)

## 2017-05-13 LAB — CREATININE, SERUM
Creatinine, Ser: 1.07 mg/dL (ref 0.61–1.24)
GFR calc Af Amer: >60 mL/min (ref >60)

## 2017-05-13 SURGERY — ANTERIOR LUMBAR FUSION 2 LEVELS
Anesthesia: General

## 2017-05-13 MED ORDER — OXYCODONE HCL 5 MG PO TABS: 5.0000 mg | ORAL_TABLET | ORAL | Status: DC | PRN

## 2017-05-13 MED ORDER — ONDANSETRON HCL 4 MG PO TABS: 4.0000 mg | ORAL_TABLET | Freq: Four times a day (QID) | ORAL | Status: DC | PRN

## 2017-05-13 MED ORDER — PHENYLEPHRINE HCL 10 MG/ML IJ SOLN
INTRAVENOUS | Status: DC | PRN
  Administered 2017-05-13: 25 ug/min via INTRAVENOUS

## 2017-05-13 MED ORDER — METHOCARBAMOL 1000 MG/10ML IJ SOLN
500.0000 mg | Freq: Four times a day (QID) | INTRAMUSCULAR | Status: DC | PRN
  Administered 2017-05-13 – 2017-05-14 (×2): 500 mg via INTRAVENOUS
  Filled 2017-05-13 (×3): qty 5

## 2017-05-13 MED ORDER — MIDAZOLAM HCL 5 MG/5ML IJ SOLN
INTRAMUSCULAR | Status: DC | PRN
  Administered 2017-05-13: 2 mg via INTRAVENOUS

## 2017-05-13 MED ORDER — SODIUM CHLORIDE 0.9 % IV SOLN: 250.0000 mL | INTRAVENOUS | Status: DC

## 2017-05-13 MED ORDER — HYDROMORPHONE HCL 1 MG/ML IJ SOLN
INTRAMUSCULAR | Status: AC
  Administered 2017-05-13: 0.5 mg via INTRAVENOUS
  Filled 2017-05-13: qty 1

## 2017-05-13 MED ORDER — SODIUM CHLORIDE 0.9% FLUSH: 3.0000 mL | Freq: Two times a day (BID) | INTRAVENOUS | Status: DC

## 2017-05-13 MED ORDER — LACTATED RINGERS IV SOLN: INTRAVENOUS | Status: DC

## 2017-05-13 MED ORDER — PHENYLEPHRINE HCL 10 MG/ML IJ SOLN
INTRAMUSCULAR | Status: DC | PRN
  Administered 2017-05-13 (×2): 100 ug via INTRAVENOUS

## 2017-05-13 MED ORDER — MAGNESIUM CITRATE PO SOLN
1.0000 | Freq: Once | ORAL | Status: AC | PRN
  Administered 2017-05-14: 1 via ORAL
  Filled 2017-05-13: qty 296

## 2017-05-13 MED ORDER — ACETAMINOPHEN 650 MG RE SUPP: 650.0000 mg | RECTAL | Status: DC | PRN

## 2017-05-13 MED ORDER — DOCUSATE SODIUM 100 MG PO CAPS
100.0000 mg | ORAL_CAPSULE | Freq: Two times a day (BID) | ORAL | Status: DC
  Administered 2017-05-13 – 2017-05-15 (×4): 100 mg via ORAL
  Filled 2017-05-13 (×4): qty 1

## 2017-05-13 MED ORDER — MIDAZOLAM HCL 2 MG/2ML IJ SOLN
INTRAMUSCULAR | Status: AC
  Filled 2017-05-13: qty 2

## 2017-05-13 MED ORDER — CHLORHEXIDINE GLUCONATE 4 % EX LIQD: 60.0000 mL | Freq: Once | CUTANEOUS | Status: DC

## 2017-05-13 MED ORDER — OXYCODONE HCL 5 MG PO TABS
ORAL_TABLET | ORAL | Status: AC
  Filled 2017-05-13: qty 2

## 2017-05-13 MED ORDER — 0.9 % SODIUM CHLORIDE (POUR BTL) OPTIME
TOPICAL | Status: DC | PRN
  Administered 2017-05-13 (×2): 1000 mL

## 2017-05-13 MED ORDER — MORPHINE SULFATE (PF) 4 MG/ML IV SOLN
2.0000 mg | INTRAVENOUS | Status: AC | PRN
  Administered 2017-05-13 – 2017-05-14 (×2): 2 mg via INTRAVENOUS
  Filled 2017-05-13 (×2): qty 1

## 2017-05-13 MED ORDER — ALBUMIN HUMAN 5 % IV SOLN
INTRAVENOUS | Status: DC | PRN
  Administered 2017-05-13: 10:00:00 via INTRAVENOUS

## 2017-05-13 MED ORDER — MENTHOL 3 MG MT LOZG: 1.0000 | LOZENGE | OROMUCOSAL | Status: DC | PRN

## 2017-05-13 MED ORDER — EPINEPHRINE PF 1 MG/ML IJ SOLN
INTRAMUSCULAR | Status: DC | PRN
  Administered 2017-05-13: .15 mL

## 2017-05-13 MED ORDER — SODIUM CHLORIDE 0.9% FLUSH: 3.0000 mL | INTRAVENOUS | Status: DC | PRN

## 2017-05-13 MED ORDER — CEFAZOLIN SODIUM-DEXTROSE 2-4 GM/100ML-% IV SOLN
2.0000 g | INTRAVENOUS | Status: AC
  Administered 2017-05-13 (×2): 2 g via INTRAVENOUS
  Filled 2017-05-13: qty 100

## 2017-05-13 MED ORDER — ONDANSETRON HCL 4 MG/2ML IJ SOLN
4.0000 mg | Freq: Four times a day (QID) | INTRAMUSCULAR | Status: DC | PRN
  Administered 2017-05-14 – 2017-05-15 (×2): 4 mg via INTRAVENOUS
  Filled 2017-05-13 (×2): qty 2

## 2017-05-13 MED ORDER — ENOXAPARIN SODIUM 40 MG/0.4ML ~~LOC~~ SOLN
40.0000 mg | SUBCUTANEOUS | Status: DC
  Administered 2017-05-14: 40 mg via SUBCUTANEOUS
  Filled 2017-05-13 (×2): qty 0.4

## 2017-05-13 MED ORDER — PROPOFOL 10 MG/ML IV BOLUS
INTRAVENOUS | Status: AC
  Filled 2017-05-13: qty 20

## 2017-05-13 MED ORDER — PHENOL 1.4 % MT LIQD: 1.0000 | OROMUCOSAL | Status: DC | PRN

## 2017-05-13 MED ORDER — EPINEPHRINE PF 1 MG/ML IJ SOLN
INTRAMUSCULAR | Status: AC
  Filled 2017-05-13: qty 1

## 2017-05-13 MED ORDER — HYDROCHLOROTHIAZIDE 25 MG PO TABS
25.0000 mg | ORAL_TABLET | Freq: Every day | ORAL | Status: DC
  Administered 2017-05-14 – 2017-05-15 (×2): 25 mg via ORAL
  Filled 2017-05-13 (×2): qty 1

## 2017-05-13 MED ORDER — METHOCARBAMOL 500 MG PO TABS
500.0000 mg | ORAL_TABLET | Freq: Four times a day (QID) | ORAL | Status: DC | PRN
  Administered 2017-05-13 – 2017-05-15 (×5): 500 mg via ORAL
  Filled 2017-05-13 (×5): qty 1

## 2017-05-13 MED ORDER — MEPERIDINE HCL 50 MG/ML IJ SOLN: 6.2500 mg | INTRAMUSCULAR | Status: DC | PRN

## 2017-05-13 MED ORDER — BUPIVACAINE HCL (PF) 0.25 % IJ SOLN
INTRAMUSCULAR | Status: AC
  Filled 2017-05-13: qty 30

## 2017-05-13 MED ORDER — ROCURONIUM BROMIDE 100 MG/10ML IV SOLN
INTRAVENOUS | Status: DC | PRN
  Administered 2017-05-13 (×2): 10 mg via INTRAVENOUS
  Administered 2017-05-13: 50 mg via INTRAVENOUS
  Administered 2017-05-13: 20 mg via INTRAVENOUS

## 2017-05-13 MED ORDER — HYDROMORPHONE HCL 1 MG/ML IJ SOLN
0.2500 mg | INTRAMUSCULAR | Status: DC | PRN
  Administered 2017-05-13 (×4): 0.5 mg via INTRAVENOUS

## 2017-05-13 MED ORDER — LACTATED RINGERS IV SOLN
INTRAVENOUS | Status: DC | PRN
  Administered 2017-05-13 (×4): via INTRAVENOUS

## 2017-05-13 MED ORDER — CEFAZOLIN SODIUM-DEXTROSE 2-4 GM/100ML-% IV SOLN
2.0000 g | Freq: Three times a day (TID) | INTRAVENOUS | Status: AC
  Administered 2017-05-13 – 2017-05-14 (×2): 2 g via INTRAVENOUS
  Filled 2017-05-13 (×2): qty 100

## 2017-05-13 MED ORDER — PROPOFOL 500 MG/50ML IV EMUL
INTRAVENOUS | Status: DC | PRN
Start: 2017-05-13 — End: 2017-05-13
  Administered 2017-05-13: 75 ug/kg/min via INTRAVENOUS

## 2017-05-13 MED ORDER — DEXAMETHASONE 4 MG PO TABS: 4.0000 mg | ORAL_TABLET | Freq: Four times a day (QID) | ORAL | Status: AC

## 2017-05-13 MED ORDER — ONDANSETRON HCL 4 MG/2ML IJ SOLN: 4.0000 mg | Freq: Once | INTRAMUSCULAR | Status: DC | PRN

## 2017-05-13 MED ORDER — SUFENTANIL CITRATE 50 MCG/ML IV SOLN
INTRAVENOUS | Status: AC
  Filled 2017-05-13: qty 1

## 2017-05-13 MED ORDER — OXYCODONE HCL 5 MG PO TABS
10.0000 mg | ORAL_TABLET | ORAL | Status: DC | PRN
  Administered 2017-05-13 – 2017-05-15 (×12): 10 mg via ORAL
  Filled 2017-05-13 (×11): qty 2

## 2017-05-13 MED ORDER — DEXAMETHASONE SODIUM PHOSPHATE 4 MG/ML IJ SOLN
4.0000 mg | Freq: Four times a day (QID) | INTRAMUSCULAR | Status: AC
  Administered 2017-05-13 – 2017-05-14 (×3): 4 mg via INTRAVENOUS
  Filled 2017-05-13 (×3): qty 1

## 2017-05-13 MED ORDER — BUPIVACAINE HCL (PF) 0.25 % IJ SOLN
INTRAMUSCULAR | Status: DC | PRN
  Administered 2017-05-13: 16 mL

## 2017-05-13 MED ORDER — LISINOPRIL 20 MG PO TABS
20.0000 mg | ORAL_TABLET | Freq: Every day | ORAL | Status: DC
  Administered 2017-05-14 – 2017-05-15 (×2): 20 mg via ORAL
  Filled 2017-05-13 (×2): qty 1

## 2017-05-13 MED ORDER — THROMBIN 20000 UNITS EX SOLR
CUTANEOUS | Status: AC
  Filled 2017-05-13: qty 20000

## 2017-05-13 MED ORDER — THROMBIN 20000 UNITS EX SOLR
CUTANEOUS | Status: DC | PRN
  Administered 2017-05-13: 20000 [IU] via TOPICAL

## 2017-05-13 MED ORDER — POLYETHYLENE GLYCOL 3350 17 G PO PACK
17.0000 g | PACK | Freq: Every day | ORAL | Status: DC | PRN
  Administered 2017-05-15: 17 g via ORAL
  Filled 2017-05-13: qty 1

## 2017-05-13 MED ORDER — CEFAZOLIN SODIUM-DEXTROSE 2-4 GM/100ML-% IV SOLN
2.0000 g | INTRAVENOUS | Status: DC
  Filled 2017-05-13: qty 100

## 2017-05-13 MED ORDER — ONDANSETRON HCL 4 MG/2ML IJ SOLN
INTRAMUSCULAR | Status: DC | PRN
  Administered 2017-05-13: 4 mg via INTRAVENOUS

## 2017-05-13 MED ORDER — LIDOCAINE HCL (CARDIAC) 20 MG/ML IV SOLN
INTRAVENOUS | Status: DC | PRN
  Administered 2017-05-13: 50 mg via INTRAVENOUS

## 2017-05-13 MED ORDER — HYDROMORPHONE HCL 1 MG/ML IJ SOLN
0.5000 mg | INTRAMUSCULAR | Status: AC | PRN
  Administered 2017-05-13 (×2): 0.5 mg via INTRAVENOUS

## 2017-05-13 MED ORDER — SUFENTANIL CITRATE 50 MCG/ML IV SOLN
INTRAVENOUS | Status: DC | PRN
  Administered 2017-05-13: 5 ug via INTRAVENOUS
  Administered 2017-05-13: 35 ug via INTRAVENOUS
  Administered 2017-05-13: 5 ug via INTRAVENOUS
  Administered 2017-05-13: 10 ug via INTRAVENOUS
  Administered 2017-05-13: 5 ug via INTRAVENOUS

## 2017-05-13 MED ORDER — PROPOFOL 10 MG/ML IV BOLUS
INTRAVENOUS | Status: DC | PRN
  Administered 2017-05-13: 130 mg via INTRAVENOUS

## 2017-05-13 MED ORDER — ACETAMINOPHEN 325 MG PO TABS: 650.0000 mg | ORAL_TABLET | ORAL | Status: DC | PRN

## 2017-05-13 MED ORDER — METHOCARBAMOL 500 MG PO TABS
ORAL_TABLET | ORAL | Status: AC
  Filled 2017-05-13: qty 1

## 2017-05-13 SURGICAL SUPPLY — 113 items
APPLIER CLIP 11 MED OPEN (CLIP) ×3
BENZOIN TINCTURE PRP APPL 2/3 (GAUZE/BANDAGES/DRESSINGS) IMPLANT
BIT DRILL QC 3.2 195 (BIT)
BIT DRILL QC 3.2 195MM (BIT) IMPLANT
BLADE CLIPPER SURG (BLADE) ×3 IMPLANT
BLADE SURG 10 STRL SS (BLADE) ×3 IMPLANT
BONE VIVIGEN FORMABLE 10CC (Bone Implant) ×3 IMPLANT
CLIP APPLIE 11 MED OPEN (CLIP) ×2 IMPLANT
CLIP LIGATING EXTRA MED SLVR (CLIP) ×3 IMPLANT
CLIP LIGATING EXTRA SM BLUE (MISCELLANEOUS) ×3 IMPLANT
CLSR STERI-STRIP ANTIMIC 1/2X4 (GAUZE/BANDAGES/DRESSINGS) ×9 IMPLANT
CORDS BIPOLAR (ELECTRODE) ×6 IMPLANT
COVER SURGICAL LIGHT HANDLE (MISCELLANEOUS) ×3 IMPLANT
DERMABOND ADVANCED (GAUZE/BANDAGES/DRESSINGS)
DERMABOND ADVANCED .7 DNX12 (GAUZE/BANDAGES/DRESSINGS) IMPLANT
DRAPE C-ARM 42X72 X-RAY (DRAPES) ×6 IMPLANT
DRAPE INCISE IOBAN 66X45 STRL (DRAPES) ×9 IMPLANT
DRAPE LAPAROSCOPIC ABDOMINAL (DRAPES) ×6 IMPLANT
DRAPE POUCH INSTRU U-SHP 10X18 (DRAPES) ×3 IMPLANT
DRAPE SURG 17X23 STRL (DRAPES) ×3 IMPLANT
DRAPE U-SHAPE 47X51 STRL (DRAPES) ×3 IMPLANT
DRILL BIT QC 3.2 195MM (BIT)
DRSG MEPILEX BORDER 4X8 (GAUZE/BANDAGES/DRESSINGS) IMPLANT
DRSG OPSITE POSTOP 4X10 (GAUZE/BANDAGES/DRESSINGS) ×3 IMPLANT
DRSG OPSITE POSTOP 4X8 (GAUZE/BANDAGES/DRESSINGS) ×3 IMPLANT
DURAPREP 26ML APPLICATOR (WOUND CARE) ×6 IMPLANT
ELECT BLADE 4.0 EZ CLEAN MEGAD (MISCELLANEOUS) ×6
ELECT CAUTERY BLADE 6.4 (BLADE) ×3 IMPLANT
ELECT PENCIL ROCKER SW 15FT (MISCELLANEOUS) ×6 IMPLANT
ELECT REM PT RETURN 9FT ADLT (ELECTROSURGICAL) ×3
ELECTRODE BLDE 4.0 EZ CLN MEGD (MISCELLANEOUS) ×4 IMPLANT
ELECTRODE REM PT RTRN 9FT ADLT (ELECTROSURGICAL) ×2 IMPLANT
FEE INTRAOP MONITOR IMPULS NCS (MISCELLANEOUS) ×2 IMPLANT
FLOSEAL 10ML (HEMOSTASIS) ×3 IMPLANT
GAUZE SPONGE 4X4 16PLY XRAY LF (GAUZE/BANDAGES/DRESSINGS) IMPLANT
GLOVE BIO SURGEON STRL SZ 6.5 (GLOVE) ×6 IMPLANT
GLOVE BIOGEL PI IND STRL 6.5 (GLOVE) ×6 IMPLANT
GLOVE BIOGEL PI IND STRL 8.5 (GLOVE) ×6 IMPLANT
GLOVE BIOGEL PI INDICATOR 6.5 (GLOVE) ×3
GLOVE BIOGEL PI INDICATOR 8.5 (GLOVE) ×3
GLOVE SS BIOGEL STRL SZ 7.5 (GLOVE) ×6 IMPLANT
GLOVE SS BIOGEL STRL SZ 8.5 (GLOVE) ×4 IMPLANT
GLOVE SUPERSENSE BIOGEL SZ 7.5 (GLOVE) ×3
GLOVE SUPERSENSE BIOGEL SZ 8.5 (GLOVE) ×2
GOWN STRL REUS W/ TWL LRG LVL3 (GOWN DISPOSABLE) ×8 IMPLANT
GOWN STRL REUS W/TWL 2XL LVL3 (GOWN DISPOSABLE) ×9 IMPLANT
GOWN STRL REUS W/TWL LRG LVL3 (GOWN DISPOSABLE) ×4
GUIDEWIRE SHARP VIPER II (WIRE) ×9 IMPLANT
HEMOSTAT SNOW SURGICEL 2X4 (HEMOSTASIS) IMPLANT
HEMOSTAT SURGICEL 2X14 (HEMOSTASIS) ×3 IMPLANT
INSERT FOGARTY 61MM (MISCELLANEOUS) IMPLANT
INSERT FOGARTY SM (MISCELLANEOUS) IMPLANT
INTRAOP MONITOR FEE IMPULS NCS (MISCELLANEOUS) ×2
INTRAOP MONITOR FEE IMPULSE (MISCELLANEOUS) ×1
KIT BASIN OR (CUSTOM PROCEDURE TRAY) ×3 IMPLANT
KIT ROOM TURNOVER OR (KITS) ×3 IMPLANT
LOOP VESSEL MAXI BLUE (MISCELLANEOUS) ×6 IMPLANT
LOOP VESSEL MINI RED (MISCELLANEOUS) ×6 IMPLANT
NEEDLE I-PASS III (NEEDLE) ×3 IMPLANT
NEEDLE SPNL 18GX3.5 QUINCKE PK (NEEDLE) ×3 IMPLANT
NS IRRIG 1000ML POUR BTL (IV SOLUTION) ×6 IMPLANT
PACK LAMINECTOMY ORTHO (CUSTOM PROCEDURE TRAY) ×3 IMPLANT
PACK UNIVERSAL I (CUSTOM PROCEDURE TRAY) ×6 IMPLANT
PAD ARMBOARD 7.5X6 YLW CONV (MISCELLANEOUS) ×12 IMPLANT
PEEK SPACER INTERPLAT 35X14X12 (Peek) ×3 IMPLANT
PEEK SPACER INTERPLAT 35X14X8 (Peek) ×3 IMPLANT
PROBE PEDCLE PROBE MAGSTM DISP (MISCELLANEOUS) ×3 IMPLANT
ROD VIPOR2 70MM PRE LARDOSED (Rod) ×3 IMPLANT
SCREW CANC HEX 6.5X25 (Screw) ×3 IMPLANT
SCREW SET SINGLE INNER MIS (Screw) ×9 IMPLANT
SCREW VIPER X-TAB 6.0X40 (Screw) ×3 IMPLANT
SCREW XTAB POLY VIPER  6X45 (Screw) ×1 IMPLANT
SCREW XTAB POLY VIPER 6X45 (Screw) ×2 IMPLANT
SPONGE INTESTINAL PEANUT (DISPOSABLE) ×9 IMPLANT
SPONGE LAP 18X18 X RAY DECT (DISPOSABLE) ×9 IMPLANT
SPONGE LAP 4X18 X RAY DECT (DISPOSABLE) IMPLANT
SPONGE SURGIFOAM ABS GEL 100 (HEMOSTASIS) ×3 IMPLANT
STAPLER VISISTAT 35W (STAPLE) IMPLANT
STRIP CLOSURE SKIN 1/2X4 (GAUZE/BANDAGES/DRESSINGS) ×6 IMPLANT
STYLET VIPER PRIME (MISCELLANEOUS) ×6 IMPLANT
SURGIFLO W/THROMBIN 8M KIT (HEMOSTASIS) IMPLANT
SUT BONE WAX W31G (SUTURE) ×3 IMPLANT
SUT MON AB 3-0 SH 27 (SUTURE) ×5
SUT MON AB 3-0 SH27 (SUTURE) ×10 IMPLANT
SUT PDS AB 1 CTX 36 (SUTURE) ×6 IMPLANT
SUT PROLENE 4 0 RB 1 (SUTURE)
SUT PROLENE 4-0 RB1 .5 CRCL 36 (SUTURE) IMPLANT
SUT PROLENE 5 0 CC1 (SUTURE) ×3 IMPLANT
SUT PROLENE 6 0 C 1 30 (SUTURE) ×3 IMPLANT
SUT PROLENE 6 0 CC (SUTURE) IMPLANT
SUT SILK 0 TIES 10X30 (SUTURE) ×3 IMPLANT
SUT SILK 2 0 TIES 10X30 (SUTURE) ×6 IMPLANT
SUT SILK 2 0SH CR/8 30 (SUTURE) IMPLANT
SUT SILK 3 0 TIES 10X30 (SUTURE) ×6 IMPLANT
SUT SILK 3 0SH CR/8 30 (SUTURE) IMPLANT
SUT VIC AB 0 CT1 18XCR BRD 8 (SUTURE) ×2 IMPLANT
SUT VIC AB 0 CT1 8-18 (SUTURE) ×1
SUT VIC AB 1 CT1 18XCR BRD 8 (SUTURE) ×4 IMPLANT
SUT VIC AB 1 CT1 27 (SUTURE) ×2
SUT VIC AB 1 CT1 27XBRD ANBCTR (SUTURE) ×4 IMPLANT
SUT VIC AB 1 CT1 8-18 (SUTURE) ×2
SUT VIC AB 2-0 CT1 18 (SUTURE) ×12 IMPLANT
SYR BULB IRRIGATION 50ML (SYRINGE) ×3 IMPLANT
TABS EXTENSION VIPER 6X35 (Neuro Prosthesis/Implant) ×2 IMPLANT
TAP CANN VIPER2 DL 5.0 (TAP) ×3 IMPLANT
TOWEL GREEN STERILE (TOWEL DISPOSABLE) ×3 IMPLANT
TOWEL OR 17X24 6PK STRL BLUE (TOWEL DISPOSABLE) ×3 IMPLANT
TOWEL OR 17X26 10 PK STRL BLUE (TOWEL DISPOSABLE) ×6 IMPLANT
TRAY FOLEY CATH SILVER 16FR (SET/KITS/TRAYS/PACK) ×3 IMPLANT
VIPER PRIME STYLET (MISCELLANEOUS) ×9
WASHER 4.5 (Orthopedic Implant) ×3 IMPLANT
WATER STERILE IRR 1000ML POUR (IV SOLUTION) IMPLANT
X-TABS VIPER 6X35 (Neuro Prosthesis/Implant) ×3 IMPLANT

## 2017-05-13 NOTE — Transfer of Care (Signed)
Immediate Anesthesia Transfer of Care Note  Patient: Jonathan Everett  Procedure(s) Performed: ANTERIOR LUMBAR FUSION L4-S1 (N/A ) POSTERIOR LUMBAR FUSION 2 LEVEL ABDOMINAL EXPOSURE (N/A )  Patient Location: PACU  Anesthesia Type:General  Level of Consciousness: sedated  Airway & Oxygen Therapy: Patient Spontanous Breathing and Patient connected to nasal cannula oxygen  Post-op Assessment: Report given to RN and Post -op Vital signs reviewed and stable  Post vital signs: Reviewed and stable  Last Vitals:  Vitals:   05/13/17 0704 05/13/17 1625  BP: (!) 140/97 (P) 139/81  Pulse:  (!) (P) 106  Resp:  (P) 16  Temp:  (P) 36.4 C  SpO2:  (P) 99%    Last Pain:  Vitals:   05/13/17 1625  PainSc: (P) 0-No pain         Complications: No apparent anesthesia complications

## 2017-05-13 NOTE — Anesthesia Preprocedure Evaluation (Signed)
Anesthesia Evaluation  Patient identified by MRN, date of birth, ID band Patient awake    Reviewed: Allergy & Precautions, NPO status , Patient's Chart, lab work & pertinent test results  Airway Mallampati: I  TM Distance: >3 FB Neck ROM: Full    Dental   Pulmonary    Pulmonary exam normal        Cardiovascular hypertension, Pt. on medications Normal cardiovascular exam     Neuro/Psych    GI/Hepatic GERD  Medicated and Controlled,  Endo/Other    Renal/GU      Musculoskeletal   Abdominal   Peds  Hematology   Anesthesia Other Findings   Reproductive/Obstetrics                             Anesthesia Physical Anesthesia Plan  ASA: II  Anesthesia Plan: General   Post-op Pain Management:    Induction: Intravenous  PONV Risk Score and Plan: 2 and Ondansetron, Dexamethasone and Midazolam  Airway Management Planned: Oral ETT  Additional Equipment:   Intra-op Plan:   Post-operative Plan: Extubation in OR  Informed Consent: I have reviewed the patients History and Physical, chart, labs and discussed the procedure including the risks, benefits and alternatives for the proposed anesthesia with the patient or authorized representative who has indicated his/her understanding and acceptance.     Plan Discussed with: CRNA and Surgeon  Anesthesia Plan Comments:         Anesthesia Quick Evaluation  

## 2017-05-13 NOTE — Brief Op Note (Signed)
05/13/2017  4:19 PM  PATIENT:  Jonathan Everett  47 y.o. male  PRE-OPERATIVE DIAGNOSIS:  Degeneration of lumbar intervertebral disc L4-S1  POST-OPERATIVE DIAGNOSIS:  Degeneration of lumbar intervertebral disc L4-S1  PROCEDURE:  Procedure(s) with comments: ANTERIOR LUMBAR FUSION L4-S1 (N/A) - 270 mins ABDOMINAL EXPOSURE (N/A) POSTERIOR LUMBAR FUSION 2 LEVEL  SURGEON:  Surgeon(s) and Role: Panel 1:    Venita Lick* Harold Mattes, MD - Primary Panel 2:    * Early, Kristen Loaderodd F, MD - Primary  PHYSICIAN ASSISTANT:   ASSISTANTS: Carmen Mayo   ANESTHESIA:   general  EBL:  345 mL   BLOOD ADMINISTERED:40 CC CELLSAVER  DRAINS: none   LOCAL MEDICATIONS USED:  MARCAINE     SPECIMEN:  No Specimen  DISPOSITION OF SPECIMEN:  N/A  COUNTS:  YES  TOURNIQUET:  * No tourniquets in log *  DICTATION: .Dragon Dictation  PLAN OF CARE: Admit to inpatient   PATIENT DISPOSITION:  PACU - hemodynamically stable.

## 2017-05-13 NOTE — Op Note (Signed)
Operative report.  Preoperative diagnosis: Degenerative disc disease with discogenic back pain L4-5 and L5-S1  Postoperative diagnosis: Same  Operative procedure: 1.  Anterior lumbar interbody fusion L4-5, and L5-S1    2.  Posterior pedicle screw fixation L4-S1.  First Assistant: Anette Riedel, PA.  Vascular surgeon for abdominal approach: Dr. Tawanna Cooler Early  Complications: Posterior left L4 pedicle breach laterally.  Prohibited left pedicle screw construct.  Unilateral pedicle screw construct L4-S1 right side.  Intraoperative neuro monitoring: No adverse activity.  All pedicle screws stimulated over 24 mA without activity.  No abnormal free running EMGs, or abnormal SSEPs.  Implants:  RSP peek intervertebral cage: L4/5: 14 x 35 x 8 degree lordosis.  L5-S1: 14 x 35 x 12 degree lordosis.   Anterior instrumentation consisting of screw with a single washer (1 hole plate) to prevent anterior migration of the cage.   Depey viper pedicle screw system.   L4: 6 x 45   L5: 6 x 40.  S1: 6 x 35.   Allograft: vivogen  Indications: This is a very pleasant 47 year old gentleman is been having progressive debilitating back pain buttock pain and intermittent leg pain.  Attempts at conservative management failed to alleviate his pain and so we elected to proceed with surgery.  All appropriate risks benefits and alternatives were discussed with the patient and consent was obtained.  Operative report.  Patient was brought the operating room placed upon the operating table.  After successful induction of general anesthesia and endotracheally patient teds SCDs and a Foley were inserted.  The abdomen was then prepped and draped in a standard fashion.  Timeout was taken to confirm patient procedure and all other important data.  X-ray was then used to identify the L4-5 and L5-S1 disc spaces and marked out the abdominal incision.  At this point time Dr. early scrubbed in and performed a standard anterior retroperitoneal  approach to the lumbar spine.  Please refer to his dictation for specifics.  Once he had the L4-5 disc space exposed and the Thompson retractor was properly positioned he scrubbed out and I scrubbed into the case.  I again confirmed and lateral fluoroscopy that I was at the L4-5 level.  Once this was done an annulotomy was performed with a 10 blade scalpel and then using a Cobb elevator pituitary rongeurs curettes and Kerrison rongeurs I resected all of the disc material.  I now could visualize posteriorly and I used a 2 and 3 mm Kerrison punch to release the posterior annulus and resect the posterior osteophyte from the posterior aspect of the vertebral bodies.  I then used my rasp trial to remove any remaining cartilage from the endplate.  Once I had nice bleeding subchondral bone I then trialed the intervertebral space and elected to use the size 14 x 35 x 8 degree lordotic peek cage.  Once I was pleased with the size of the cage I then checked to ensure I had parallel distraction of the endplates.  Using a lamina spreader I distracted the intervertebral space using live fluoroscopy to confirm that there was increased foraminal volume and then I had parallel endplate distraction.  This cage was obtained and packed with the vision.  I malleted the cage to the appropriate depth.  I confirmed satisfactory position in both the AP and lateral planes.  Once the cage was properly seated I then used an all to broach the L5 vertebral body and then placed a 30 mm length 6.5 titanium screw with a  washer to stabilize the peek cage and prevent anterior migration.  This screw had excellent purchase.  At this point time Dr. early then scrubbed back into the case to reposition the retractor and exposed the L5-S1 disc space.  With this disc space exposed I used the same technique to perform an L5-S1 discectomy.  First confirmed with x-ray that I was at the appropriate level and then I performed an annulotomy with a 10 blade  scalpel.  Pituitary rongeurs curettes and Kerrison rongeurs were used to remove the disc material.  A 2 mm Kerrison rongeur was used to release the posterior annulus and resect the osteophyte from the posterior aspect of the vertebral bodies of L5 and S1.  I confirmed that I had parallel distraction using the live fluoroscopy and lamina spreader.  Using a trial rasps I trialed and elected to use the size 14 x 35 x 12 degree lordotic peek cage.  The cage was obtained packed with the allograft and malleted to the appropriate depth.  A 25 mm length screw with a washer was used at this level.  Again I had excellent contact with the peek cage to prevent anterior migration.  At this point I was very pleased with the 2 level ALIF.  I sequentially removed the retractors ensure there was no bleeding.  Once all the retractors were removed I turned the pulse ox on which was attached to the left toe and it was reading 100% saturation.  This wound was copiously irrigated with normal saline and then I closed the fascia of the rectus with a running #1 Prolene suture.  I then closed superficially with a layered fashion with interrupted 0 Vicryl suture, 2-0 Vicryl suture and 3-0 Monocryl.  Steri-Strips and a dry dressing were applied.  At this point time the neuro monitoring needles were placed per the neuro monitor representative and then the spine frame was secured over the patient.  The bed was then rotated 180 degrees so the patient was now resting in the prone position.  The flat Jean RosenthalJackson was removed and the arms were placed overhead on the arm holder and the back was then prepped and draped in a standard fashion.  A second timeout was then taken confirming patient procedure and any other important data.  On the left-hand side small incision was made and the Jamshidi needle was extended down to the lateral aspect of the S1 pedicle.  I confirmed satisfactory position of both the AP and lateral planes.  In the AP plane I  advanced the guidepin into the S1 pedicle.  I then directly stimulated as I advanced the screw.  During this process I became concerned that this screw was not obtaining purchase.  Given my difficulty I removed it and then obtained the Jamshidi needle in order to place the Jamshidi needle first.  I extended the incision which was a left Wiltsie incision.  I was able to palpate the transverse process of L4 and so I placed the Jamshidi needle on the lateral side of the facet and advanced it into the L4 pedicle.  Once I was nearing the medial wall of the pedicle confirmed in the lateral plane that I was just beyond the posterior wall of the vertebral body.  I advanced this into the vertebral body.  The direct stimulation demonstrated no evidence of abnormal EMG activity to suggest breach of the pedicle.  I then obtained the pedicle screw and placed it down the same trajectory.  Unfortunately the pedicle  screw migrated a little lateral and then broke out the lateral pedicle wall.  Immediately there were abnormal EEG activity.  The AP and lateral plain x-rays confirmed that the pedicle screw had breached laterally and was most likely stimulating the lumbar plexus.  Once I remove the screw the EMGs returned to normal.  At this point I elected to go to the contralateral side.  On the right side I was able to the easily pass the Jamshidi needles through the L4-L5 and S1 pedicles.  There was no adverse EMG activity and all 3 pedicles were easily cannulated.  I then tapped and then placed the appropriate size pedicle screws over the guidepin into the vertebral body.  I directly stimulated all 3 of these right pedicle screws and there was no adverse activity below 24 mA.  There is also no abnormal SSEP activity and no free running EMG activity.  At this point with a solid right-sided pedicle screw construct I measured and placed the rod and then locked the rod to the pedicle screw using the top nuts.  All 3 top nuts were  torqued off according manufacturer standards.  I then went back to the left side and attempted to reposition the left pedicle screw but again I was getting abnormal activity.  At this point fearing injury to either the plexus or the nerve root medially I elected to abort and not put the left-sided pedicle screw construct in.  Both wounds were copiously irrigated with normal saline and closed in a layered fashion with interrupted #1 Vicryl sutures 2-0 Vicryl sutures and 3-0 Monocryl.  Steri-Strips dry dressings were applied and the patient was ultimately extubated and transferred the PACU without incident.  The end of the case all needle sponge counts were correct.

## 2017-05-13 NOTE — Anesthesia Postprocedure Evaluation (Signed)
Anesthesia Post Note  Patient: Jonathan Everett  Procedure(s) Performed: ANTERIOR LUMBAR FUSION L4-S1 (N/A ) POSTERIOR LUMBAR FUSION 2 LEVEL ABDOMINAL EXPOSURE (N/A )     Patient location during evaluation: PACU Anesthesia Type: General Level of consciousness: awake and alert Pain management: pain level controlled Vital Signs Assessment: post-procedure vital signs reviewed and stable Respiratory status: spontaneous breathing, nonlabored ventilation, respiratory function stable and patient connected to nasal cannula oxygen Cardiovascular status: blood pressure returned to baseline and stable Postop Assessment: no apparent nausea or vomiting Anesthetic complications: no    Last Vitals:  Vitals:   05/13/17 1740 05/13/17 1745  BP:  135/78  Pulse: 95 95  Resp: 13 15  Temp:  36.5 C  SpO2: 91% 97%    Last Pain:  Vitals:   05/13/17 1745  PainSc: 8                  Kahmari Koller,W. EDMOND

## 2017-05-13 NOTE — Anesthesia Procedure Notes (Signed)
Procedure Name: Intubation Date/Time: 05/13/2017 8:51 AM Performed by: Eligha Bridegroom, CRNA Pre-anesthesia Checklist: Patient identified, Emergency Drugs available, Suction available, Patient being monitored and Timeout performed Patient Re-evaluated:Patient Re-evaluated prior to induction Oxygen Delivery Method: Circle system utilized Preoxygenation: Pre-oxygenation with 100% oxygen Induction Type: IV induction Ventilation: Mask ventilation without difficulty Laryngoscope Size: Mac and 4 Grade View: Grade II Tube type: Oral Tube size: 8.0 mm Number of attempts: 1 Airway Equipment and Method: Stylet Placement Confirmation: ETT inserted through vocal cords under direct vision,  positive ETCO2 and breath sounds checked- equal and bilateral Secured at: 23 cm Tube secured with: Tape Dental Injury: Teeth and Oropharynx as per pre-operative assessment

## 2017-05-13 NOTE — Op Note (Signed)
    OPERATIVE REPORT  DATE OF SURGERY: 05/13/2017  PATIENT: Jonathan Everett, 47 y.o. male MRN: [id number]  DOB: 05/06/1970  PRE-OPERATIVE DIAGNOSIS: Degenerative disc disease  POST-OPERATIVE DIAGNOSIS:  Same  PROCEDURE: Anterior exposure for L4-5 and L5-S1 disc surgery  SURGEON:  Gretta Beganodd Berthe Oley, M.D.  Co-surgeon for the exposure Dr. Shon BatonBrooks  ANESTHESIA: General  EBL: 200 ml  Total I/O In: 2000 [I.V.:2000] Out: 475 [Urine:275; Blood:200]  BLOOD ADMINISTERED: None  DRAINS: None  SPECIMEN: None  COUNTS CORRECT:  YES  PLAN OF CARE: PACU  PATIENT DISPOSITION:  PACU - hemodynamically stable  PROCEDURE DETAILS: The patient was taken to the operating placed in supine position where the area of the abdomen was prepped and draped in usual sterile fashion.  Crosstable lateral was used to identify the level of the L4-5 and L5-S1 disc.  A left paramedian incision was made and carried down through the subcutaneous fat with electrocautery.  The anterior rectus sheath was opened in line with the incision.  The rectus muscle was mobilized circumferentially.  The right peritoneal space was entered bluntly in the left lower quadrant and the intracranial contents were mobilized to the right.  The posterior rectus sheath was opened laterally.  Blunt dissection was continued to mobilize the ureter and intraperitoneal contents above the level of the psoas.  The tissues overlying the L5-S1 disc were bluntly mobilized and the middle sacral vessels were clipped and divided.  There was an area of a branch of the medial aspect of the iliac vein that did have some bleeding this was controlled with 5-0 Prolene figure-of-eight suture.  Continued superior and inferior dissection was taken bluntly to give adequate exposure to the L5-S1 disc.  Next the iliac vessels and aorta were mobilized to the lumbar branches were clipped.  Iliolumbar vein was ligated and divided.  Dissection over the L4-5 disc was used to give  adequate exposure.  [facility] Thompson retractor was brought onto the field and the reverse lip 150 blades were positioned to the right and left of the L4-5 disc and malleable retractors were used for superior and inferior exposure.  A spinal needle was placed in the disc and C-arm was used to confirm that this was the appropriate level.  Dr. Shon BatonBrooks will dictate the discectomy and fusion.  I then scrubbed back in and repositioned the reverse lip 150 blades to the right and left of the L5-S1 disc in the malleable retractor superiorly and inferiorly.  The remainder of the surgery will be dictated as a separate note with Dr. Shon BatonBrooks.   Jonathan Everett, M.D., [facility] 05/13/2017 12:06 PM

## 2017-05-14 ENCOUNTER — Inpatient Hospital Stay (HOSPITAL_COMMUNITY): Payer: BLUE CROSS/BLUE SHIELD

## 2017-05-14 DIAGNOSIS — Z9889 Other specified postprocedural states: Secondary | ICD-10-CM

## 2017-05-14 MED ORDER — OXYCODONE-ACETAMINOPHEN 10-325 MG PO TABS: 1.0000 | ORAL_TABLET | ORAL | 0 refills | Status: AC | PRN

## 2017-05-14 MED ORDER — ONDANSETRON 4 MG PO TBDP: 4.0000 mg | ORAL_TABLET | Freq: Three times a day (TID) | ORAL | 0 refills | Status: DC | PRN

## 2017-05-14 MED ORDER — METHOCARBAMOL 500 MG PO TABS: 500.0000 mg | ORAL_TABLET | Freq: Four times a day (QID) | ORAL | 0 refills | Status: AC | PRN

## 2017-05-14 MED ORDER — HYDROXYZINE HCL 50 MG/ML IM SOLN
50.0000 mg | Freq: Four times a day (QID) | INTRAMUSCULAR | Status: DC | PRN
  Administered 2017-05-14 – 2017-05-15 (×2): 50 mg via INTRAMUSCULAR
  Filled 2017-05-14 (×2): qty 1

## 2017-05-14 MED FILL — Sodium Chloride IV Soln 0.9%: INTRAVENOUS | Qty: 1000 | Status: AC

## 2017-05-14 MED FILL — Heparin Sodium (Porcine) Inj 1000 Unit/ML: INTRAMUSCULAR | Qty: 30 | Status: AC

## 2017-05-14 NOTE — Progress Notes (Signed)
Patient ID: Jonathan Everett, male   DOB: 06/26/1970, 47 y.o.   MRN: [id number] Patient is comfortable this morning.  Reports some abdominal soreness if he laughs but otherwise no issue.  Did have nausea with pain medication yesterday which she reports is typical.  Is eating liquid diet this morning without difficulty.  Reports she has not passed flatus.  Abdominal incision covered with mild abdominal soreness and tenderness.  Palpable pedal pulses.  Plan per Dr. Shon BatonBrooks

## 2017-05-14 NOTE — Discharge Instructions (Signed)
Spinal Fusion, Care After °These instructions give you information about caring for yourself after your procedure. Your doctor may also give you more specific instructions. Call your doctor if you have any problems or questions after your procedure. °Follow these instructions at home: °Medicines °· Take over-the-counter and prescription medicines only as told by your doctor. These include any medicines for pain. °· Do not drive for 24 hours if you received a sedative. °· Do not drive or use heavy machinery while taking prescription pain medicine. °· If you were prescribed an antibiotic medicine, take it as told by your doctor. Do not stop taking the antibiotic even if you start to feel better. °Surgical Cut (Incision) Care °· Follow instructions from your doctor about how to take care of your surgical cut. Make sure you: °? Wash your hands with soap and water before you change your bandage (dressing). If you cannot use soap and water, use hand sanitizer. °? Change your bandage as told by your doctor. °? Leave stitches (sutures), skin glue, or skin tape (adhesive) strips in place. They may need to stay in place for 2 weeks or longer. If tape strips get loose and curl up, you may trim the loose edges. Do not remove tape strips completely unless your doctor says it is okay. °· Keep your surgical cut clean and dry. Do not take baths, swim, or use a hot tub until your doctor says it is okay. °· Check your surgical cut and the area around it every day for: °? Redness. °? Swelling. °? Fluid. °Physical Activity °· Return to your normal activities as told by your doctor. Ask your doctor what activities are safe for you. Rest and protect your back as much as you can. °· Follow instructions from your doctor about how to move. Use good posture to help your spine heal. °· Do not lift anything that is heavier than 8 lb (3.6 kg) or as told by your doctor until he or she says that it is safe. Do not lift anything over your  head. °· Do not twist or bend at the waist until your doctor says it is okay. °· Avoid pushing or pulling motions. °· Do not sit or lie down in the same position for long periods of time. °· Do not start to exercise until your doctor says it is okay. Ask your doctor what kinds of exercise you can do to make your back stronger. °General instructions °· If you were given a brace, use it as told by your doctor. °· Wear compression stockings as told by your doctor. °· Do not use tobacco products. These include cigarettes, chewing tobacco, or e-cigarettes. If you need help quitting, ask your doctor. °· Keep all follow-up visits as told by your doctor. This is important. This includes any visits with your physical therapist, if this applies. °Contact a doctor if: °· Your pain gets worse. °· Your medicine does not help your pain. °· Your legs or feet become painful or swollen. °· Your surgical cut is red, swollen, or painful. °· You have fluid, blood, or pus coming from your surgical cut. °· You feel sick to your stomach (nauseous). °· You throw up (vomit). °· Your have weakness or loss of feeling (numbness) in your legs that is new or getting worse. °· You have a fever. °· You have trouble controlling when you pee (urinate) or poop (have a bowel movement). °Get help right away if: °· Your pain is very bad. °· You have   chest pain. °· You have trouble breathing. °· You start to have a cough. °These symptoms may be an emergency. Do not wait to see if the symptoms will go away. Get medical help right away. Call your local emergency services (911 in the [place] Do not drive yourself to the hospital. °This information is not intended to replace advice given to you by your health care provider. Make sure you discuss any questions you have with your health care provider. °Document Released: 06/20/2010 Document Revised: 10/23/2015 Document Reviewed: 08/09/2014 °[employer] Interactive Patient Education © 2018 [employer] ° °

## 2017-05-14 NOTE — Evaluation (Signed)
Occupational Therapy Evaluation Patient Details Name: Jonathan Everett MRN: [id number] DOB: 04/05/1970 Today's Date: 05/14/2017    History of Present Illness Pt is a 47 y/o male who presents s/p ALIF L4-S1 and posterior lumbar fusion L4-S1 on 05/13/17.   Clinical Impression   This 47 y/o M presents with the above. At baseline Pt is independent with ADLs and functional mobility. Pt completed room and hallway level functional mobility without AD with overall MinGuard assist this session; currently requires MinA for LB ADLs secondary to adhering to back precautions. Education provided on AE, safety and compensatory techniques for completing ADLs and functional transfers while adhering to precautions with pt return demonstrating and verbalizing understanding. Pt will return home with spouse who is able to provide ADL assist PRN. Pt/pt spouse both report feeling comfortable completing ADLs after return home, questions answered throughout. No further acute OT needs identified at this time. Will sign off.     Follow Up Recommendations  Follow surgeon's recommendation for DC plan and follow-up therapies;Supervision/Assistance - 24 hour    Equipment Recommendations  3 in 1 bedside commode           Precautions / Restrictions Precautions Precautions: Fall;Back Precaution Booklet Issued: Yes (comment) Precaution Comments: Reviewed handout with pt and wife; pt able to recall 2/3 back precautions independently  Required Braces or Orthoses: Spinal Brace Spinal Brace: Lumbar corset;Applied in sitting position Restrictions Weight Bearing Restrictions: No      Mobility Bed Mobility Overal bed mobility: Needs Assistance Bed Mobility: Sit to Sidelying         Sit to sidelying: Mod assist General bed mobility comments: modA for LE management when returning to sidelying; verbal cues for log roll technique   Transfers Overall transfer level: Needs assistance Equipment used: None Transfers: Sit  to/from Stand Sit to Stand: Min assist         General transfer comment: min HHA to rise from recliner; increased time/effort; pt demonstrating safe hand placement    Balance Overall balance assessment: Mild deficits observed, not formally tested                                         ADL either performed or assessed with clinical judgement   ADL Overall ADL's : Needs assistance/impaired Eating/Feeding: Modified independent;Sitting   Grooming: Min guard;Standing   Upper Body Bathing: Min guard;Sitting   Lower Body Bathing: Minimal assistance;Sit to/from stand;With adaptive equipment   Upper Body Dressing : Sitting;Min guard Upper Body Dressing Details (indicate cue type and reason): pt doffing spinal brace in sitting prior to return to sidelying/supine  Lower Body Dressing: Minimal assistance;Sit to/from stand;With adaptive equipment Lower Body Dressing Details (indicate cue type and reason): educated pt on use of reacher and sock aide for completing LB ADLs, pt return demonstrating use of reacher  Toilet Transfer: Min guard;Ambulation;BSC Toilet Transfer Details (indicate cue type and reason): BSC over toilet; simulated through transfer recliner to EOB  Toileting- Clothing Manipulation and Hygiene: Min guard;Sit to/from stand Toileting - Clothing Manipulation Details (indicate cue type and reason): educated on compensatory techniques/AE for completing task should pt need increased assist to adhere to precautions during peri-care    Tub/Shower Transfer Details (indicate cue type and reason): educated on safe transfer technique to tub and on use of 3:1 as shower seat during task completion with pt verbalizing understanding  Functional mobility during ADLs: Minimal assistance;Min guard  General ADL Comments: educated pt/pt spouse on AE and compensatory techniques for completing ADLs while adhering to precautions with pt/pt spouse verbalizing understanding                           Pertinent Vitals/Pain Pain Assessment: Faces Faces Pain Scale: Hurts little more Pain Location: Back - incision site Pain Descriptors / Indicators: Operative site guarding Pain Intervention(s): Monitored during session;Repositioned;Limited activity within patient's tolerance          Extremity/Trunk Assessment Upper Extremity Assessment Upper Extremity Assessment: Overall WFL for tasks assessed   Lower Extremity Assessment Lower Extremity Assessment: Defer to PT evaluation   Cervical / Trunk Assessment Cervical / Trunk Assessment: Other exceptions Cervical / Trunk Exceptions: s/p surgery   Communication Communication Communication: No difficulties   Cognition Arousal/Alertness: Awake/alert Behavior During Therapy: WFL for tasks assessed/performed Overall Cognitive Status: Within Functional Limits for tasks assessed                                                      Home Living Family/patient expects to be discharged to:: Private residence Living Arrangements: Spouse/significant other Available Help at Discharge: Family;Available 24 hours/day Type of Home: House Home Access: Stairs to enter [employer] of Steps: 3 Entrance Stairs-Rails: Right Home Layout: One level     Bathroom Shower/Tub: Chief Strategy Officer: Standard     Home Equipment: None          Prior Functioning/Environment Level of Independence: Independent                 OT Problem List: Decreased activity tolerance;Decreased range of motion;Decreased knowledge of use of DME or AE;Decreased knowledge of precautions            OT Goals(Current goals can be found in the care plan section) Acute Rehab OT Goals Patient Stated Goal: Home tomorrow OT Goal Formulation: All assessment and education complete, DC therapy                                 AM-PAC PT "6 Clicks" Daily Activity     Outcome Measure  Help from another person eating meals?: None Help from another person taking care of personal grooming?: None Help from another person toileting, which includes using toliet, bedpan, or urinal?: A Little Help from another person bathing (including washing, rinsing, drying)?: A Little Help from another person to put on and taking off regular upper body clothing?: A Little Help from another person to put on and taking off regular lower body clothing?: A Little 6 Click Score: 20   End of Session Equipment Utilized During Treatment: Back brace Nurse Communication: Mobility status  Activity Tolerance: Patient tolerated treatment well Patient left: in bed;with call bell/phone within reach;with family/visitor present  OT Visit Diagnosis: Other abnormalities of gait and mobility (R26.89)                Time: 1610-9604 OT Time Calculation (min): 23 min Charges:  OT General Charges $OT Visit: 1 Visit OT Evaluation $OT Eval Low Complexity: 1 Low G-Codes:     Marcy Siren, OT Pager [phone] 05/14/2017   Orlando Penner 05/14/2017, 4:15 PM

## 2017-05-14 NOTE — Progress Notes (Signed)
VASCULAR LAB PRELIMINARY  PRELIMINARY  PRELIMINARY  PRELIMINARY  Bilateral lower extremity venous duplex completed.    Preliminary report:  There is no DVT or SVT noted in the bilateral lower extremities.   Maximillian Habibi, RVT 05/14/2017, 8:45 AM

## 2017-05-14 NOTE — Evaluation (Signed)
Physical Therapy Evaluation Patient Details Name: Jonathan Everett MRN: [id number] DOB: [birth year] Today's Date: 05/14/2017   History of Present Illness  Pt is a 47 y/o male who presents s/p ALIF L4-S1 and posterior lumbar fusion L4-S1 on 05/13/17.  Clinical Impression  Pt admitted with above diagnosis. Pt currently with functional limitations due to the deficits listed below (see PT Problem List). At the time of PT eval pt was able to perform transfers and ambulation with close supervision for safety, and negotiated stairs with min guard assist. Overall pt moving slow due to pain but anticipate he will progress well with mobility. Pt will benefit from skilled PT to increase their independence and safety with mobility to allow discharge to the venue listed below.       Follow Up Recommendations No PT follow up;Supervision for mobility/OOB    Equipment Recommendations  None recommended by PT    Recommendations for Other Services       Precautions / Restrictions Precautions Precautions: Fall;Back Precaution Booklet Issued: Yes (comment) Precaution Comments: Reviewed handout with pt and wife and pt was cued for precautions during functional mobility.  Required Braces or Orthoses: Spinal Brace Spinal Brace: Lumbar corset;Applied in sitting position Restrictions Weight Bearing Restrictions: No      Mobility  Bed Mobility               General bed mobility comments: Pt sitting up on EOB with nursing when PT arrived.  Transfers Overall transfer level: Needs assistance Equipment used: None Transfers: Sit to/from Stand Sit to Stand: Supervision         General transfer comment: Pt demonstrated proper hand placement on seated surface for safety as pt powered up to full standing position. Noted pt walking up thighs to full stand as he fatigued towards end of session.   Ambulation/Gait Ambulation/Gait assistance: Supervision Ambulation Distance (Feet): 200 Feet Assistive device:  None Gait Pattern/deviations: Step-through pattern;Decreased stride length Gait velocity: Decreased Gait velocity interpretation: Below normal speed for age/gender General Gait Details: Slow and guarded. Close supervision provided for overall safety.   Stairs Stairs: Yes Stairs assistance: Min guard Stair Management: One rail Right;Step to pattern;Forwards;Sideways Number of Stairs: 3 General stair comments: VC's for sequencing and general safety with stair negotiation. Pt was able to ascend forwards but held to the railing sideways to descend.   Wheelchair Mobility    Modified Rankin (Stroke Patients Only)       Balance Overall balance assessment: Mild deficits observed, not formally tested(guarded due to pain)                                           Pertinent Vitals/Pain Pain Assessment: Faces Faces Pain Scale: Hurts little more Pain Location: Back - incision site Pain Descriptors / Indicators: Operative site guarding Pain Intervention(s): Limited activity within patient's tolerance;Monitored during session;Repositioned    Home Living Family/patient expects to be discharged to:: Private residence Living Arrangements: Spouse/significant other Available Help at Discharge: Family;Available 24 hours/day Type of Home: House Home Access: Stairs to enter Entrance Stairs-Rails: Right Entrance Stairs-Number of Steps: 3 Home Layout: One level Home Equipment: None      Prior Function Level of Independence: Independent               Hand Dominance        Extremity/Trunk Assessment   Upper Extremity Assessment Upper Extremity Assessment: Overall  WFL for tasks assessed    Lower Extremity Assessment Lower Extremity Assessment: Generalized weakness    Cervical / Trunk Assessment Cervical / Trunk Assessment: Other exceptions Cervical / Trunk Exceptions: s/p surgery  Communication   Communication: No difficulties  Cognition  Arousal/Alertness: Awake/alert Behavior During Therapy: WFL for tasks assessed/performed Overall Cognitive Status: Within Functional Limits for tasks assessed                                        General Comments      Exercises     Assessment/Plan    PT Assessment Patient needs continued PT services  PT Problem List Decreased strength;Decreased range of motion;Decreased activity tolerance;Decreased balance;Decreased mobility;Decreased knowledge of use of DME;Decreased safety awareness;Decreased knowledge of precautions;Pain       PT Treatment Interventions DME instruction;Gait training;Stair training;Functional mobility training;Therapeutic activities;Therapeutic exercise;Neuromuscular re-education;Patient/family education    PT Goals (Current goals can be found in the Care Plan section)  Acute Rehab PT Goals Patient Stated Goal: Home tomorrow PT Goal Formulation: With patient Time For Goal Achievement: 05/21/17 Potential to Achieve Goals: Good    Frequency Min 5X/week   Barriers to discharge        Co-evaluation               AM-PAC PT "6 Clicks" Daily Activity  Outcome Measure Difficulty turning over in bed (including adjusting bedclothes, sheets and blankets)?: None Difficulty moving from lying on back to sitting on the side of the bed? : A Little Difficulty sitting down on and standing up from a chair with arms (e.g., wheelchair, bedside commode, etc,.)?: A Little Help needed moving to and from a bed to chair (including a wheelchair)?: A Little Help needed walking in hospital room?: A Little Help needed climbing 3-5 steps with a railing? : A Little 6 Click Score: 19    End of Session Equipment Utilized During Treatment: Gait belt;Back brace Activity Tolerance: Patient tolerated treatment well Patient left: in chair;with call bell/phone within reach;with family/visitor present Nurse Communication: Mobility status PT Visit Diagnosis:  Unsteadiness on feet (R26.81);Pain Pain - part of body: (back)    Time: [id number] PT Time Calculation (min) (ACUTE ONLY): 34 min   Charges:   PT Evaluation $PT Eval Moderate Complexity: 1 Mod PT Treatments $Gait Training: 8-22 mins   PT G Codes:        Conni SlipperLaura Chamara Dyck, PT, DPT Acute Rehabilitation Services Pager: [phone]   Marylynn PearsonLaura D Peretz Thieme 05/14/2017, 2:06 PM

## 2017-05-14 NOTE — Progress Notes (Signed)
PT Cancellation Note  Patient Details Name: Jonathan Everett MRN: [id number] DOB: 10/21/1970   Cancelled Treatment:    Reason Eval/Treat Not Completed: Patient at procedure or test/unavailable. Will follow up later today to initiate PT eval when pt is available.   Marylynn PearsonLaura D Gagan Dillion 05/14/2017, 8:35 AM   Conni SlipperLaura Jahree Dermody, PT, DPT Acute Rehabilitation Services Pager: [phone]

## 2017-05-14 NOTE — Progress Notes (Signed)
    Subjective: 1 Day Post-Op Procedure(s) (LRB): ANTERIOR LUMBAR FUSION L4-S1 (N/A) ABDOMINAL EXPOSURE (N/A) POSTERIOR LUMBAR FUSION 2 LEVEL Patient reports pain as 4 on 0-10 scale.   Denies CP or SOB.  Voiding without difficulty. Positive flatus. Objective: Vital signs in last 24 hours: Temp:  [97.6 F (36.4 C)-98.7 F (37.1 C)] 98.7 F (37.1 C) (03/07 0400) Pulse Rate:  [91-109] 109 (03/07 0400) Resp:  [12-22] 22 (03/07 0400) BP: (115-151)/(64-93) 151/93 (03/07 0400) SpO2:  [91 %-100 %] 100 % (03/07 0400)  Intake/Output from previous day: 03/06 0701 - 03/07 0700 In: 3750 [I.V.:3400; IV Piggyback:350] Out: 1695 [Urine:1350; Blood:345] Intake/Output this shift: No intake/output data recorded.  Labs: Recent Labs    05/13/17 1842  HGB 12.8*   Recent Labs    05/13/17 1842  WBC 14.7*  RBC 4.84  HCT 39.8  PLT 246   Recent Labs    05/13/17 1842  CREATININE 1.07   No results for input(s): LABPT, INR in the last 72 hours.  Physical Exam: Neurologically intact ABD soft Neurovascular intact Sensation intact distally Dorsiflexion/Plantar flexion intact Incision: scant drainage Compartment soft Body mass index is 33.19 kg/m.   Assessment/Plan: 1 Day Post-Op Procedure(s) (LRB): ANTERIOR LUMBAR FUSION L4-S1 (N/A) ABDOMINAL EXPOSURE (N/A) POSTERIOR LUMBAR FUSION 2 LEVEL Advance diet Up with therapy Plan for discharge tomorrow  [place], Baxter KailCarmen Christina for Dr. Venita Lickahari Brooks [facility] [phone] 05/14/2017, 7:27 AM

## 2017-05-15 ENCOUNTER — Encounter (HOSPITAL_COMMUNITY): Payer: Self-pay | Admitting: Orthopedic Surgery

## 2017-05-15 ENCOUNTER — Other Ambulatory Visit: Payer: Self-pay

## 2017-05-15 MED ORDER — FLEET ENEMA 7-19 GM/118ML RE ENEM
1.0000 | ENEMA | Freq: Once | RECTAL | Status: AC
  Administered 2017-05-15: 1 via RECTAL
  Filled 2017-05-15: qty 1

## 2017-05-15 MED ORDER — MAGNESIUM CITRATE PO SOLN
1.0000 | Freq: Once | ORAL | Status: AC
  Administered 2017-05-15: 1 via ORAL
  Filled 2017-05-15: qty 296

## 2017-05-15 NOTE — Progress Notes (Signed)
    Subjective: Procedure(s) (LRB): ANTERIOR LUMBAR FUSION L4-S1 (N/A) ABDOMINAL EXPOSURE (N/A) POSTERIOR LUMBAR FUSION 2 LEVEL 2 Days Post-Op  Patient reports pain as 4 on 0-10 scale.  Reports decreased leg pain reports incisional back pain   Positive void Negative bowel movement Positive flatus Negative chest pain or shortness of breath  Objective: Vital signs in last 24 hours: Temp:  [98.7 F (37.1 C)-100 F (37.8 C)] 98.9 F (37.2 C) (03/08 0400) Pulse Rate:  [96-115] 105 (03/08 0400) Resp:  [18-20] 20 (03/08 0400) BP: (93-111)/(55-77) 105/62 (03/08 0400) SpO2:  [95 %-99 %] 95 % (03/08 0400)  Intake/Output from previous day: 03/07 0701 - 03/08 0700 In: 240 [P.O.:240] Out: -   Labs: Recent Labs    05/13/17 1842  WBC 14.7*  RBC 4.84  HCT 39.8  PLT 246   Recent Labs    05/13/17 1842  CREATININE 1.07   No results for input(s): LABPT, INR in the last 72 hours.  Physical Exam: Neurologically intact ABD soft Intact pulses distally Incision: dressing C/D/I Compartment soft Body mass index is 33.19 kg/m.   Assessment/Plan: Patient stable  xrays n/a Doppler: negative Continue mobilization with physical therapy Continue care  Advance diet Up with therapy  Plan on d/c to home either later today or in AM Enema this AM for constipation: if positive BM then ok for d/c today  Venita Lickahari Braxtyn Bojarski, MD [facility] [phone]

## 2017-05-15 NOTE — Progress Notes (Signed)
Patient ID: Jonathan Everett, male   DOB: 04/07/1970, 47 y.o.   MRN: [id number] Comfortable.  No further nausea.  Has had a bowel movement. Ready for discharge today per Dr. Shon BatonBrooks

## 2017-05-15 NOTE — Progress Notes (Signed)
Physical Therapy Treatment Patient Details Name: Jonathan Everett MRN: [id number] DOB: 1970-12-14 Today's Date: 05/15/2017    History of Present Illness Pt is a 47 y/o male who presents s/p ALIF L4-S1 and posterior lumbar fusion L4-S1 on 05/13/17.    PT Comments    Focus of session was on reviewing back precautions with pt and pt's wife, as well as transfer training. Pt able to perform transfers with min guard (no physical assistance needed). PT also demonstrated and instructed pt in car transfers. PT reiterated the back precautions and that "no lifting" applies to pushing and pulling as well. Pt and pt's wife expressed understanding. Pt concerned that he has not had a BM yet. PT will continue to follow pt acutely to ensure a safe d/c home. Encouraged mobility as much as possible.   Follow Up Recommendations  No PT follow up;Supervision for mobility/OOB     Equipment Recommendations  None recommended by PT    Recommendations for Other Services       Precautions / Restrictions Precautions Precautions: Fall;Back Precaution Comments: Reviewed 3/3 back precautions with pt  Required Braces or Orthoses: Spinal Brace Spinal Brace: Lumbar corset;Applied in sitting position Restrictions Weight Bearing Restrictions: No    Mobility  Bed Mobility               General bed mobility comments: sitting EOB upon arrival  Transfers Overall transfer level: Needs assistance Equipment used: None Transfers: Sit to/from Stand Sit to Stand: Min guard         General transfer comment: increased time and effort, min guard for safety  Ambulation/Gait Ambulation/Gait assistance: Supervision Ambulation Distance (Feet): 20 Feet Assistive device: None Gait Pattern/deviations: Step-through pattern;Decreased stride length Gait velocity: Decreased Gait velocity interpretation: Below normal speed for age/gender General Gait Details: slow, cautious gait within room; distance limited with focus of  session on reviewed precautions and practicing transfers   Stairs            Wheelchair Mobility    Modified Rankin (Stroke Patients Only)       Balance Overall balance assessment: Needs assistance Sitting-balance support: Feet supported Sitting balance-Leahy Scale: Good     Standing balance support: During functional activity;No upper extremity supported Standing balance-Leahy Scale: Good                              Cognition Arousal/Alertness: Awake/alert Behavior During Therapy: WFL for tasks assessed/performed Overall Cognitive Status: Within Functional Limits for tasks assessed                                        Exercises      General Comments        Pertinent Vitals/Pain Pain Assessment: Faces Faces Pain Scale: Hurts little more Pain Location: Back - incision site Pain Descriptors / Indicators: Operative site guarding Pain Intervention(s): Monitored during session;Repositioned    Home Living                      Prior Function            PT Goals (current goals can now be found in the care plan section) Acute Rehab PT Goals PT Goal Formulation: With patient Time For Goal Achievement: 05/21/17 Potential to Achieve Goals: Good Progress towards PT goals: Progressing toward goals    Frequency  Min 5X/week      PT Plan Current plan remains appropriate    Co-evaluation              AM-PAC PT "6 Clicks" Daily Activity  Outcome Measure  Difficulty turning over in bed (including adjusting bedclothes, sheets and blankets)?: None Difficulty moving from lying on back to sitting on the side of the bed? : None Difficulty sitting down on and standing up from a chair with arms (e.g., wheelchair, bedside commode, etc,.)?: None Help needed moving to and from a bed to chair (including a wheelchair)?: None Help needed walking in hospital room?: None Help needed climbing 3-5 steps with a railing? : A  Little 6 Click Score: 23    End of Session Equipment Utilized During Treatment: Back brace Activity Tolerance: Patient tolerated treatment well Patient left: in chair;with call bell/phone within reach;with family/visitor present Nurse Communication: Mobility status PT Visit Diagnosis: Unsteadiness on feet (R26.81);Pain Pain - part of body: (back)     Time: [id number] PT Time Calculation (min) (ACUTE ONLY): 12 min  Charges:  $Therapeutic Activity: 8-22 mins                    G Codes:       [place] VillageJennifer Braylon Lemmons, [place]PT, [place]PT [phone]    Alessandra BevelsJennifer M Kinnick Maus 05/15/2017, 9:17 AM

## 2017-05-15 NOTE — Progress Notes (Signed)
Patient is discharged from room 3C11 at this time. Alert and in stable condition. IV site d/c'd and instructions read to patient and spouse with understanding verbalized. Left unit via wheelchair with all belongings at side. 

## 2017-05-18 NOTE — Discharge Summary (Signed)
Physician Discharge Summary  Patient ID: Jonathan Everett MRN: [id number] DOB/AGE: 1971-02-09 47 y.o.  Admit date: 05/13/2017 Discharge date: 05/15/2017  Admission Diagnoses:  Lumbar Degenerative disc disease  Discharge Diagnoses:  Active Problems:   Back pain   Past Medical History:  Diagnosis Date  . Arthritis    "back" (09/20/2014)  . Chronic lower back pain   . Family history of adverse reaction to anesthesia    "daughter turns into a ball of mush" (09/20/2014)  . GERD (gastroesophageal reflux disease)   . Headache    "monthly" (09/20/2014)  . Hypertension   . Seizures (HCC) 2014 X 1; 2015 X 1   "my potassium was real low"     Surgeries: Procedure(s): ANTERIOR LUMBAR FUSION L4-S1 ABDOMINAL EXPOSURE POSTERIOR LUMBAR FUSION 2 LEVEL on 05/13/2017   Consultants (if any):   Discharged Condition: Improved  Hospital Course: Jonathan Everett is an 47 y.o. male who was admitted 05/13/2017 with a diagnosis of  Lumbar degenerative disc disease and went to the operating room on 05/13/2017 and underwent the above named procedures.  Post op day one pt reports moderate pain.  Pt is urinating w/o difficulty.  Pt was nauseated overnight. Post op day 2 pt reports moderate pain controlled on oral medication. Pt is ambulating in hallway and cleared by PT for DC.   He was given perioperative antibiotics:  Anti-infectives (From admission, onward)   Start     Dose/Rate Route Frequency Ordered Stop   05/13/17 1830  ceFAZolin (ANCEF) IVPB 2g/100 mL premix     2 g 200 mL/hr over 30 Minutes Intravenous Every 8 hours 05/13/17 1824 05/14/17 0221   05/13/17 1245  ceFAZolin (ANCEF) IVPB 2g/100 mL premix  Status:  Discontinued     2 g 200 mL/hr over 30 Minutes Intravenous To Surgery 05/13/17 1236 05/13/17 1801   05/13/17 0545  ceFAZolin (ANCEF) IVPB 2g/100 mL premix     2 g 200 mL/hr over 30 Minutes Intravenous To ShortStay Surgical 05/13/17 0537 05/13/17 1235    .  He was given sequential compression  devices, early ambulation, and TED for DVT prophylaxis.  He benefited maximally from the hospital stay and there were no complications.    Recent vital signs:  Vitals:   05/15/17 0400 05/15/17 0759  BP: 105/62 126/83  Pulse: (!) 105 (!) 116  Resp: 20 20  Temp: 98.9 F (37.2 C) 98.4 F (36.9 C)  SpO2: 95% 97%    Recent laboratory studies:  Lab Results  Component Value Date   HGB 12.8 (L) 05/13/2017   HGB 14.6 05/05/2017   HGB 11.4 (L) 09/20/2014   Lab Results  Component Value Date   WBC 14.7 (H) 05/13/2017   PLT 246 05/13/2017   No results found for: INR Lab Results  Component Value Date   NA 137 05/05/2017   K 3.6 05/05/2017   CL 106 05/05/2017   CO2 22 05/05/2017   BUN 13 05/05/2017   CREATININE 1.07 05/13/2017   GLUCOSE 87 05/05/2017    Discharge Medications:   Allergies as of 05/15/2017   No Known Allergies     Medication List    STOP taking these medications   ibuprofen 200 MG tablet Commonly known as:  ADVIL,MOTRIN   ibuprofen 600 MG tablet Commonly known as:  ADVIL,MOTRIN     TAKE these medications   fluticasone 50 MCG/ACT nasal spray Commonly known as:  FLONASE Place 1 spray into both nostrils daily as needed for allergies.   hydrochlorothiazide 25  MG tablet Commonly known as:  HYDRODIURIL Take 25 mg by mouth daily.   lisinopril 20 MG tablet Commonly known as:  PRINIVIL,ZESTRIL TAKE 1 TABLET BY MOUTH DAILY   methocarbamol 500 MG tablet Commonly known as:  ROBAXIN Take 1 tablet (500 mg total) by mouth every 6 (six) hours as needed for muscle spasms.   NEXIUM PO Take 1 tablet by mouth daily as needed (INDIGESTION).   ondansetron 4 MG disintegrating tablet Commonly known as:  ZOFRAN ODT Take 1 tablet (4 mg total) by mouth every 8 (eight) hours as needed for nausea or vomiting.   oxyCODONE-acetaminophen 10-325 MG tablet Commonly known as:  PERCOCET Take 1 tablet by mouth every 4 (four) hours as needed for up to 5 days for pain.        Diagnostic Studies: Dg Lumbar Spine Complete  Result Date: 05/13/2017 CLINICAL DATA:  47 y/o  M; L4-S1 ALIF and posterior fusion. EXAM: LUMBAR SPINE - COMPLETE 4+ VIEW; DG C-ARM 61-120 MIN COMPARISON:  None. FINDINGS: Four fluoroscopic images of L4-S1 ALIF and posterior fusion with anterior L4-5 screws and right-sided transpedicle posterior screws as well as interbody prostheses. Fluoro time is 7 minutes 21 seconds. IMPRESSION: L4-S1 ALIF and posterior fusion. Fluoro time is 7 minutes 21 seconds. Electronically Signed   By: Mitzi Hansen M.D.   On: 05/13/2017 16:20   Dg C-arm 1-60 Min  Result Date: 05/13/2017 CLINICAL DATA:  47 y/o  M; L4-S1 ALIF and posterior fusion. EXAM: LUMBAR SPINE - COMPLETE 4+ VIEW; DG C-ARM 61-120 MIN COMPARISON:  None. FINDINGS: Four fluoroscopic images of L4-S1 ALIF and posterior fusion with anterior L4-5 screws and right-sided transpedicle posterior screws as well as interbody prostheses. Fluoro time is 7 minutes 21 seconds. IMPRESSION: L4-S1 ALIF and posterior fusion. Fluoro time is 7 minutes 21 seconds. Electronically Signed   By: Mitzi Hansen M.D.   On: 05/13/2017 16:20   Dg C-arm 1-60 Min  Result Date: 05/13/2017 CLINICAL DATA:  47 y/o  M; L4-S1 ALIF and posterior fusion. EXAM: LUMBAR SPINE - COMPLETE 4+ VIEW; DG C-ARM 61-120 MIN COMPARISON:  None. FINDINGS: Four fluoroscopic images of L4-S1 ALIF and posterior fusion with anterior L4-5 screws and right-sided transpedicle posterior screws as well as interbody prostheses. Fluoro time is 7 minutes 21 seconds. IMPRESSION: L4-S1 ALIF and posterior fusion. Fluoro time is 7 minutes 21 seconds. Electronically Signed   By: Mitzi Hansen M.D.   On: 05/13/2017 16:20   Dg Or Local Abdomen  Result Date: 05/13/2017 CLINICAL DATA:  46 year old male status post anterior lumbar fusion, evaluate retained instrument or foreign body. EXAM: OR LOCAL ABDOMEN COMPARISON:  Fluoroscopic images from lumbar disc  injections 04/13/2017. FINDINGS: Portable AP supine view at 1219 hours. Small surgical clips project about the central sacrum and left SI joint with anterior approach appearing cortical screws and interbody implants projecting over the lower lumbar spine and lumbosacral junction. No other No radiopaque foreign body identified. Negative visible bowel gas pattern. Intact pelvis. IMPRESSION: No unexpected radiopaque foreign body identified about the lower lumbar spine or visible pelvis. This was called to OR room Four Provider Lenn Cal on 05/13/2017 at 12:41 . Electronically Signed   By: Odessa Fleming M.D.   On: 05/13/2017 12:42    Disposition: 01-Home or Self Care Post op medication provided for pt Pt will f/u in clinic in 2 weeks Discharge Instructions    Incentive spirometry RT   Complete by:  As directed       Follow-up  Information    Venita LickBrooks, Dahari, MD Follow up in 2 week(s).   Specialty:  Orthopedic Surgery Contact information: [address] [place] [address] [phone]            Signed: Kirt BoysMayo, Cleve Paolillo Christina 05/18/2017, 10:08 AM

## 2017-05-28 ENCOUNTER — Encounter (HOSPITAL_COMMUNITY): Payer: Self-pay | Admitting: Orthopedic Surgery

## 2017-05-29 ENCOUNTER — Encounter (HOSPITAL_COMMUNITY): Payer: Self-pay | Admitting: Orthopedic Surgery

## 2019-01-13 IMAGING — XA DG DISKOGRAPHY LUMBAR S+I
6 series · 6 of 6 positions shown · non-contrast
Comparison: none

CLINICAL DATA: Low back pain extending into the right lower
extremity. Displacement of the L4-5 and L5 lumbar discs.

[Series 1: ortho adipose · 1 of 1 slices shown (1 of 6)]
[im 1/1]
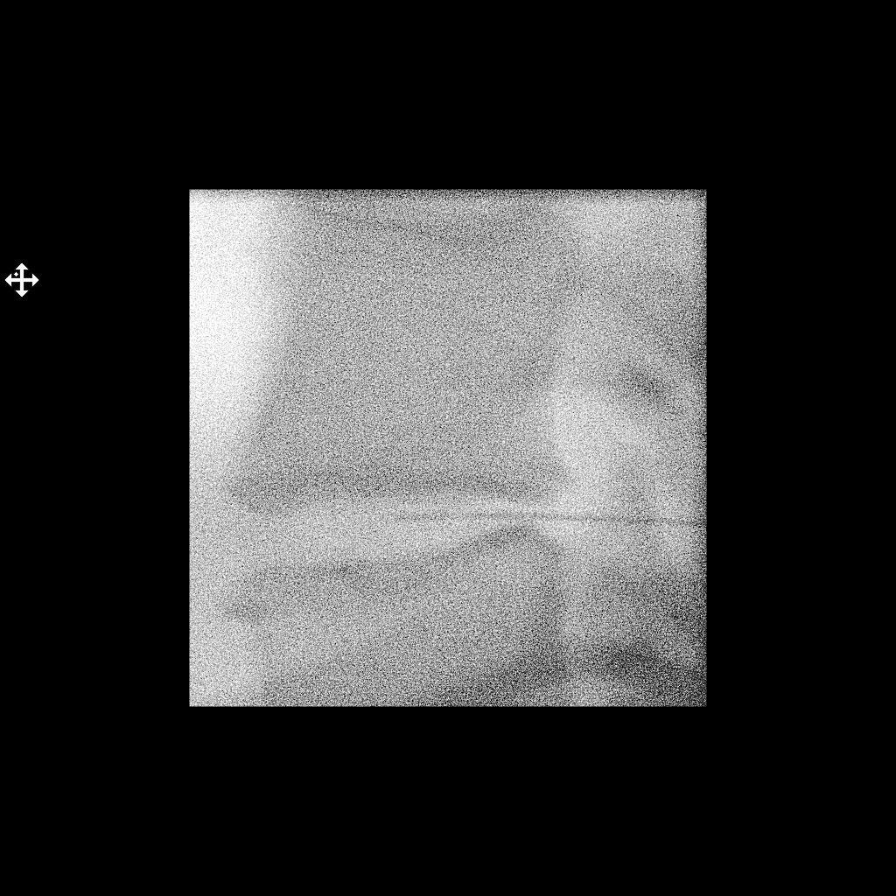

[Series 2: ortho adipose · 1 of 1 slices shown (2 of 6)]
[im 1/1]
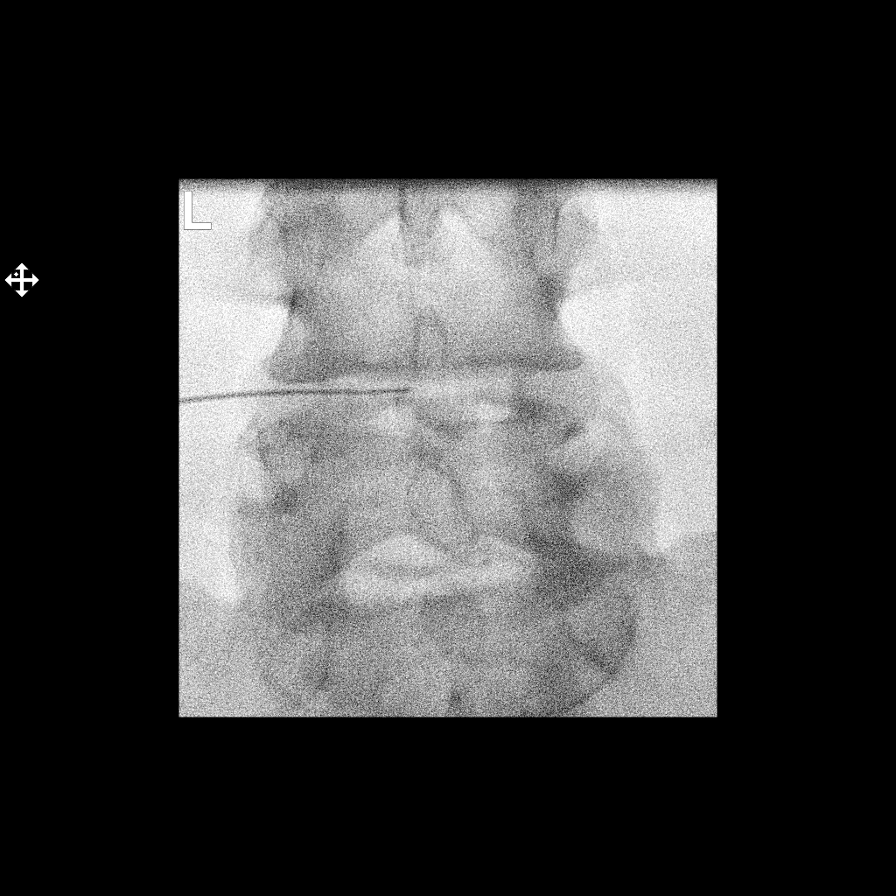

[Series 3: ortho adipose · 1 of 1 slices shown (3 of 6)]
[im 1/1]
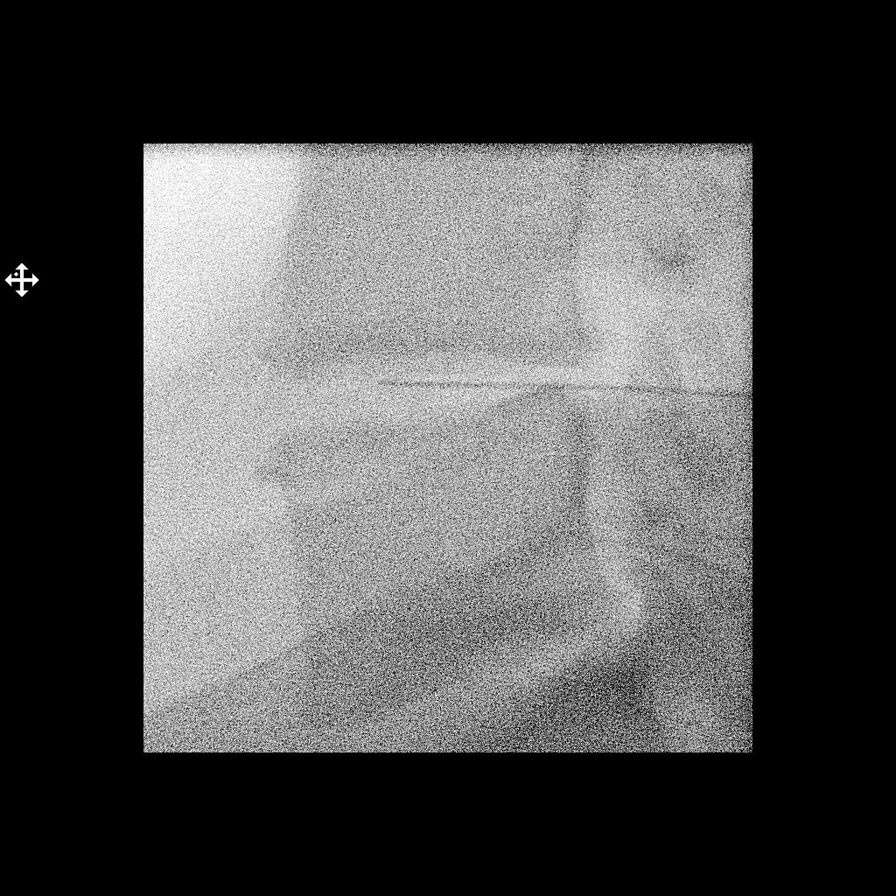

[Series 4: ortho adipose · 1 of 1 slices shown (4 of 6)]
[im 1/1]
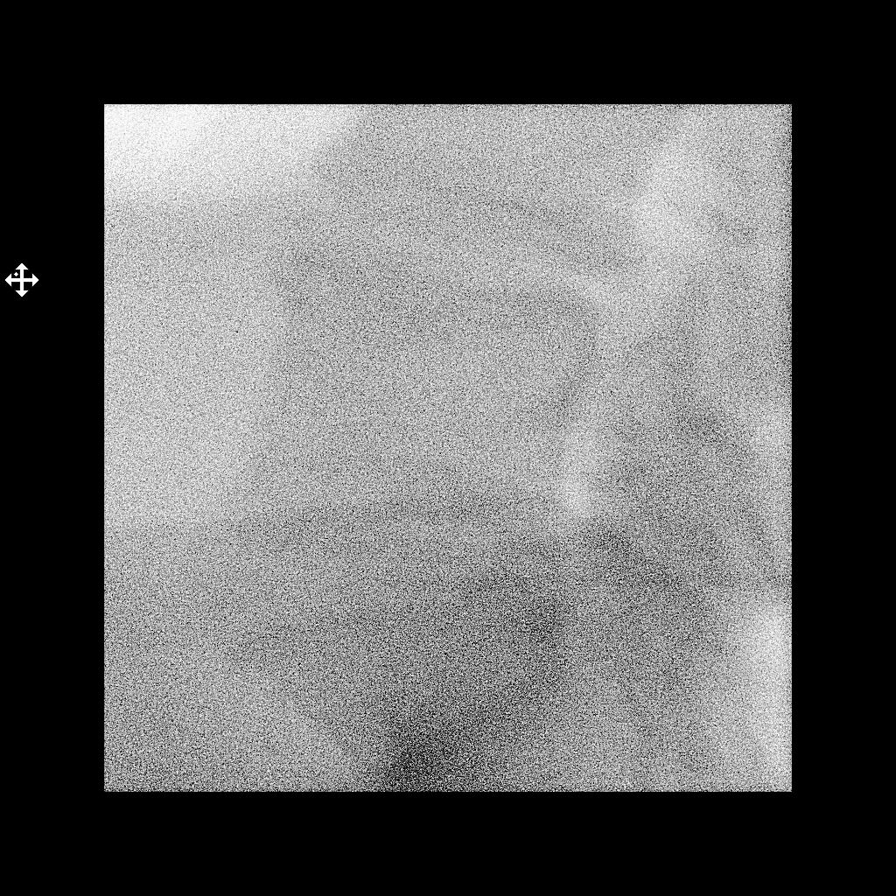

[Series 5: ortho adipose · 1 of 1 slices shown (5 of 6)]
[im 1/1]
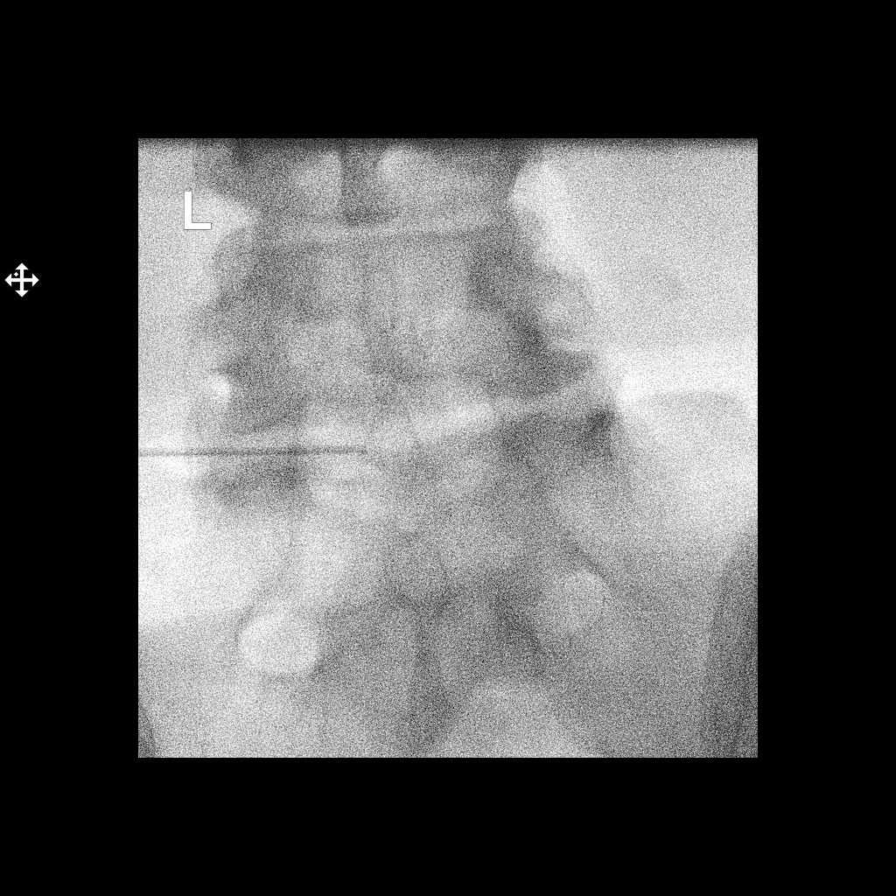

[Series 6: ortho adipose · 1 of 1 slices shown (6 of 6)]
[im 1/1]
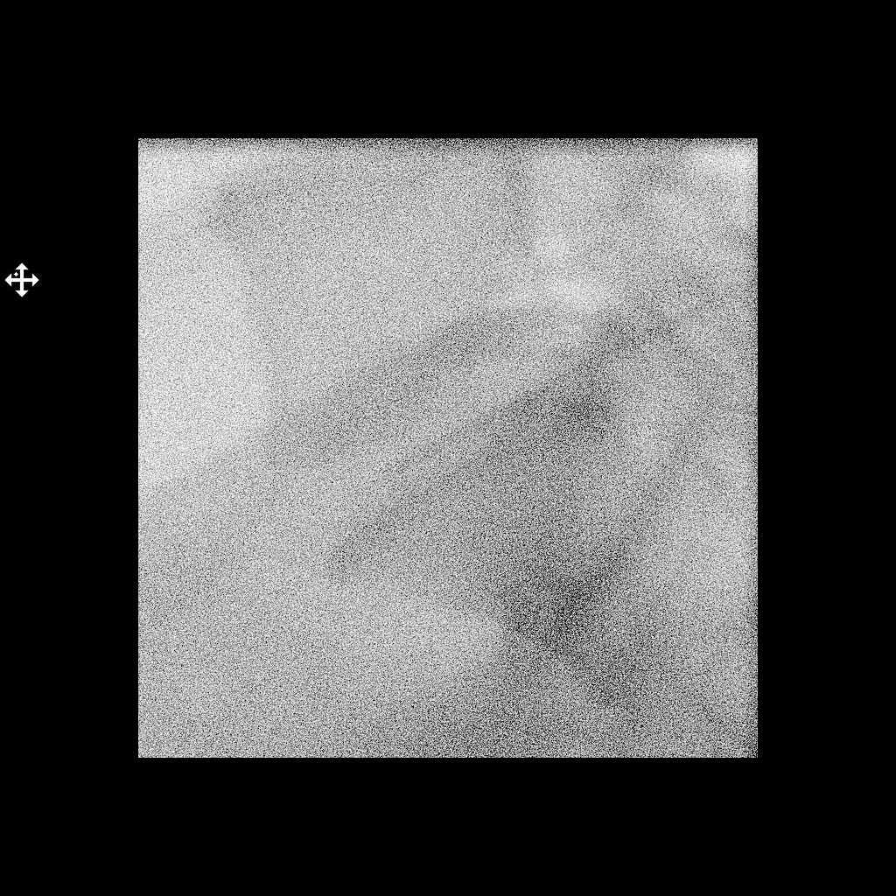

[6 of 6 positions shown; findings below may reference images not displayed]

EXAM:
DIAGNOSTIC LUMBAR DISK INJECTIONS

FLUOROSCOPY TIME:  Radiation Exposure Index (as provided by the
fluoroscopic device): 5.10 uGy*m2

Fluoroscopy Time:  1 minute 0 seconds

Number of Acquired Images:  0

PROCEDURE:
The procedure was discussed in depth with the patient including the
potential risk of infection. The patient received 2 gm amoxicillin
by mouth prior to the procedure. 10 mg Valium was provided by mouth
prior to the procedure.

The patient was placed prone on the fluoroscopic table. A Betadine
scrub of the low back was performed and the patient was draped in a
sterile fashion.

Skin anesthesia was carried [DATE]% Lidocaine. 15 cm 22 gauge
Chiba needle was directed into the nuclear region of the disk at
L4-5. A 20 cm directed into the clear region 22 gauge Chiba needle
of the disc at L5-S1. Injected 1.5 mL of 2% lidocaine at both
levels.
FINDINGS: The patient had some replication of is typical low back pain at both
levels. Symptoms extend into the right lower extremity greater at
the L5-S1 level.
IMPRESSION: Technically successful anesthetic injection into the nuclear region
of the L4-5 and L5-S1 discs via a left posterolateral oblique
approach.

## 2019-02-12 IMAGING — CR DG OR LOCAL ABDOMEN
1 series · 1 of 1 positions shown · non-contrast
Comparison: Fluoroscopic images from lumbar disc injections
04/13/2017.

CLINICAL DATA: 47-year-old male status post anterior lumbar fusion,
evaluate retained instrument or foreign body.

EXAM:
OR LOCAL ABDOMEN

[AP]
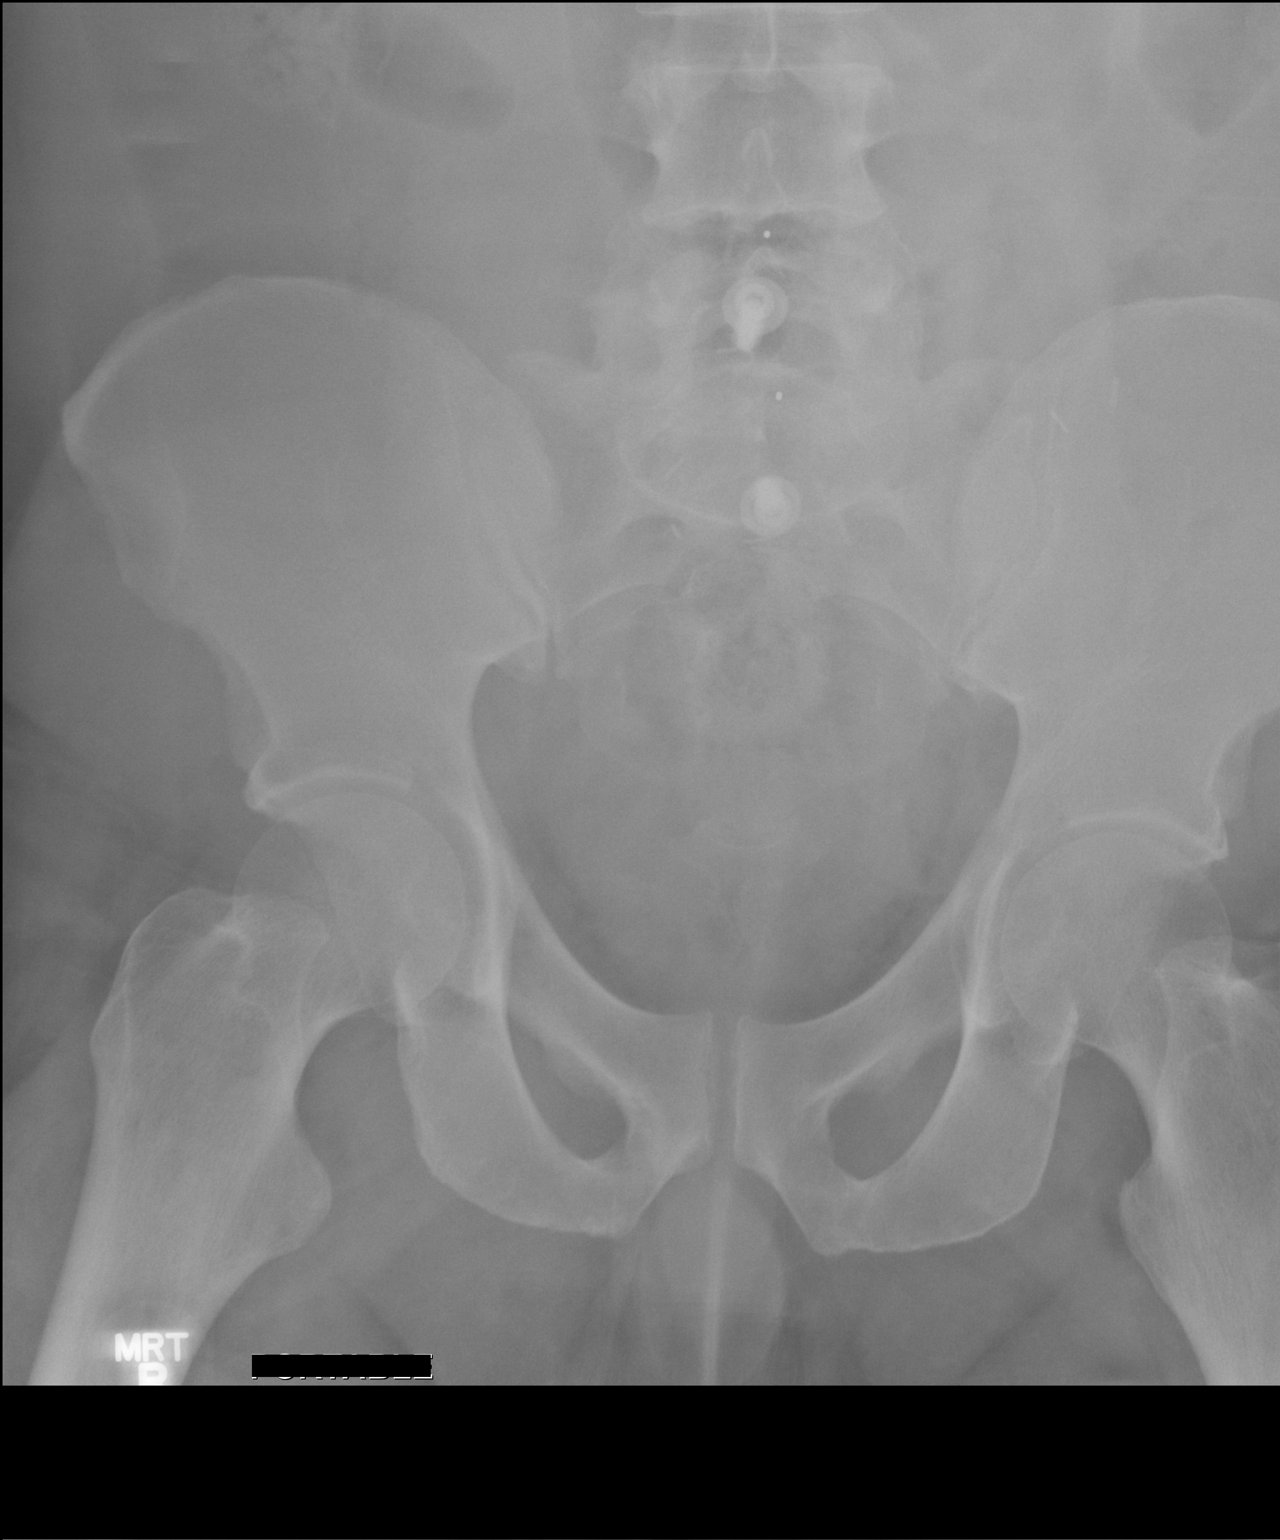

[1 of 1 positions shown; findings below may reference images not displayed]

FINDINGS: Portable AP supine view at 0801 hours. Small surgical clips project
about the central sacrum and left SI joint with anterior approach
appearing cortical screws and interbody implants projecting over the
lower lumbar spine and lumbosacral junction.

No other No radiopaque foreign body identified. Negative visible
bowel gas pattern. Intact pelvis.
IMPRESSION: No unexpected radiopaque foreign body identified about the lower
lumbar spine or visible pelvis.

This was called to OR room Four Provider Neimante Platten on 05/13/2017

## 2019-02-12 IMAGING — RF DG C-ARM 61-120 MIN
1 series · 4 of 4 positions shown · non-contrast
Comparison: None.

CLINICAL DATA: 47 y/o  M; L4-S1 ALIF and posterior fusion.

EXAM:
LUMBAR SPINE - COMPLETE 4+ VIEW; DG C-ARM 61-120 MIN

[Series 1: run · 4 of 4 slices shown]
[im 1/4]
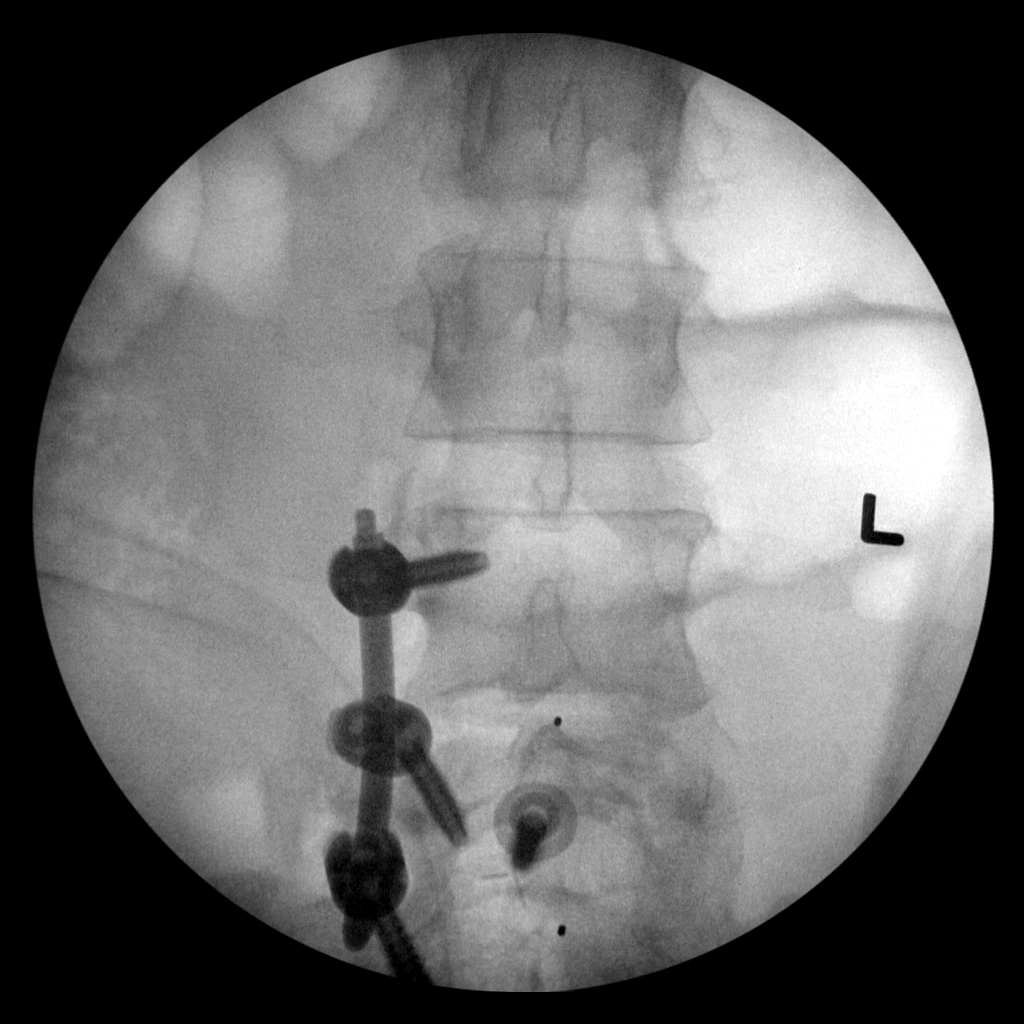
[im 2/4]
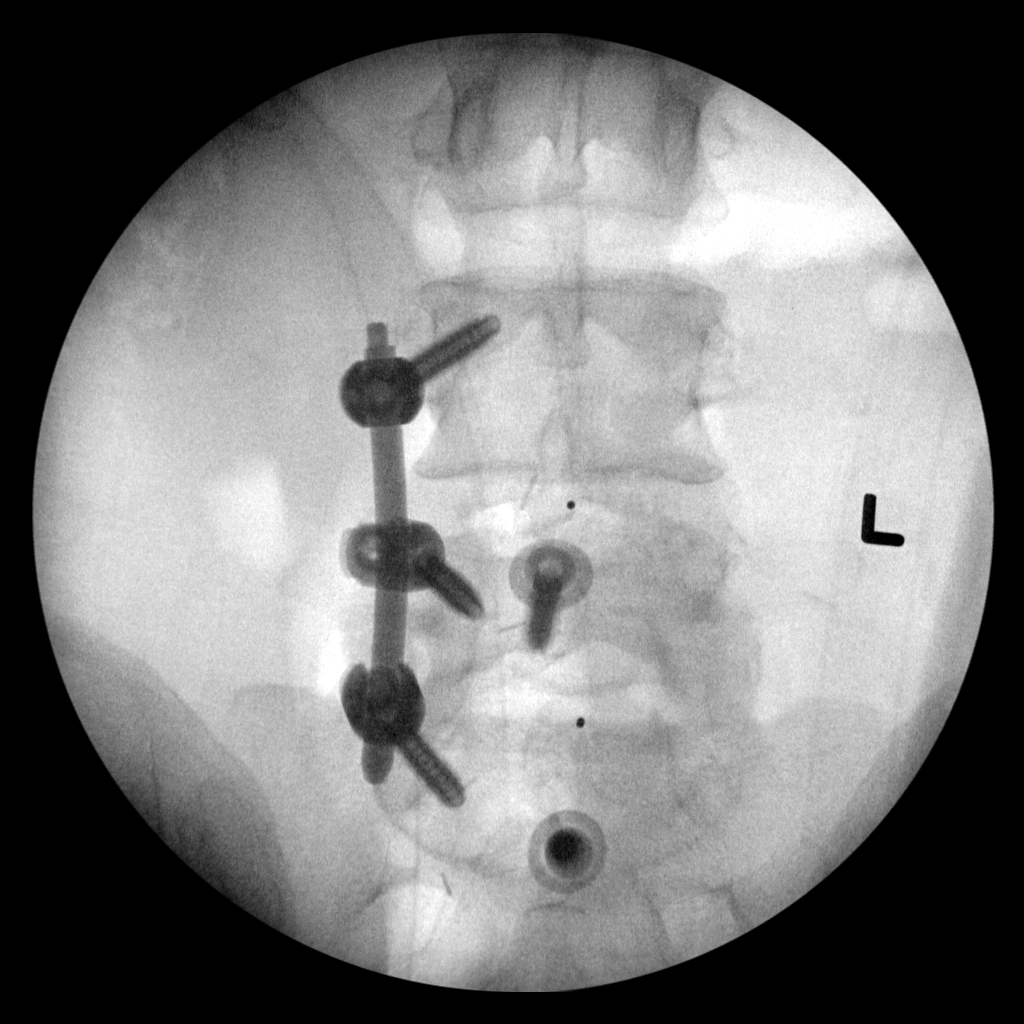
[im 3/4]
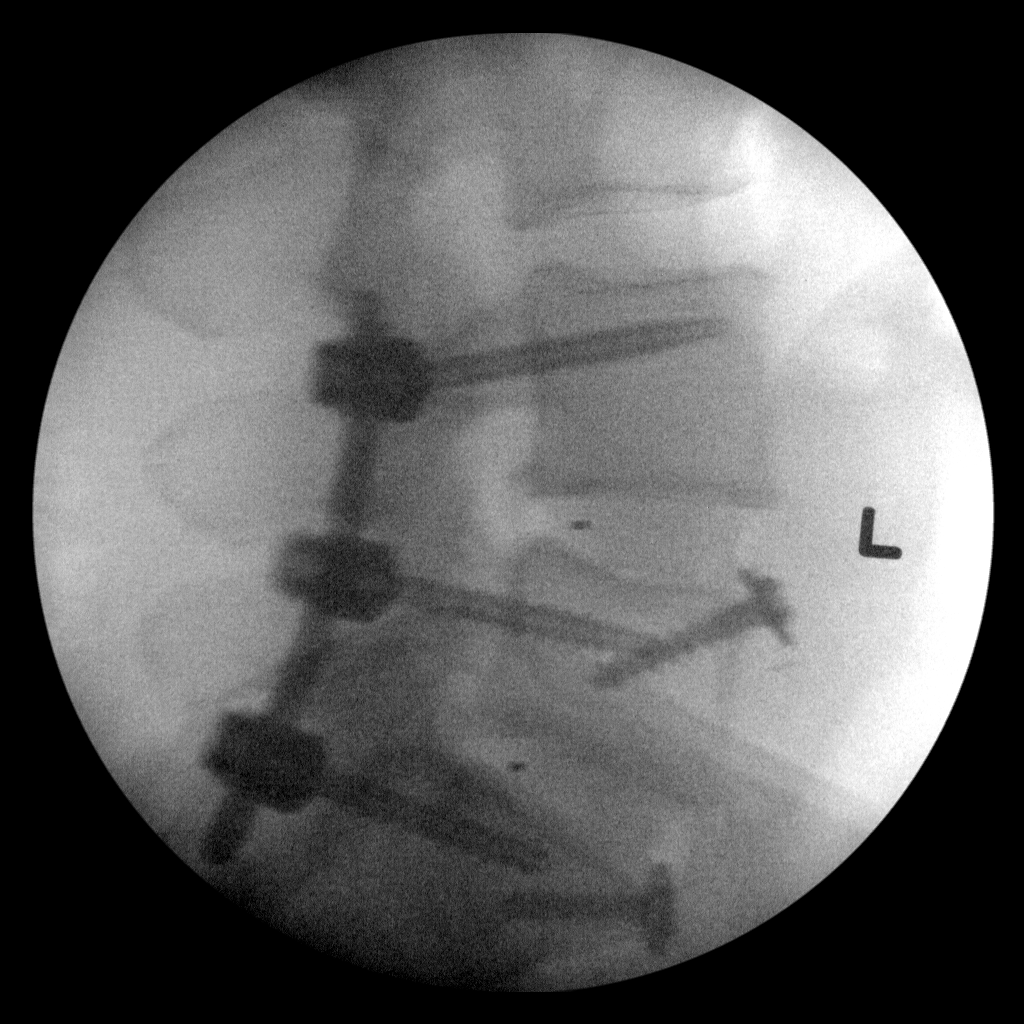
[im 4/4]
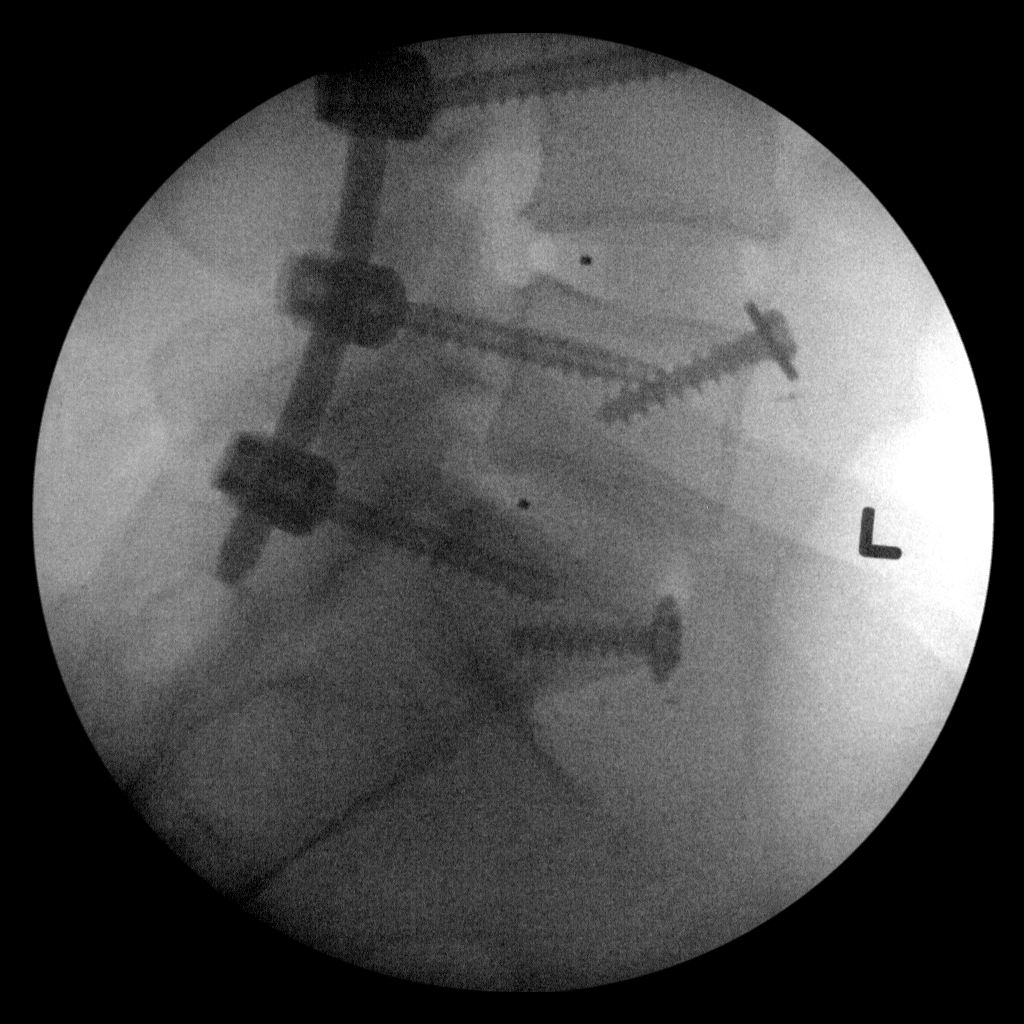

[4 of 4 positions shown; findings below may reference images not displayed]

FINDINGS: Four fluoroscopic images of L4-S1 ALIF and posterior fusion with
anterior L4-5 screws and right-sided transpedicle posterior screws
as well as interbody prostheses. Fluoro time is 7 minutes 21
seconds.
IMPRESSION: L4-S1 ALIF and posterior fusion. Fluoro time is 7 minutes 21
seconds.

By: Orhanrehimov Hamidov M.D.

## 2019-07-26 ENCOUNTER — Encounter: Payer: Self-pay | Admitting: Diagnostic Neuroimaging

## 2019-07-26 ENCOUNTER — Other Ambulatory Visit: Payer: Self-pay

## 2019-07-26 ENCOUNTER — Other Ambulatory Visit: Payer: Self-pay | Admitting: *Deleted

## 2019-07-26 ENCOUNTER — Encounter: Payer: Self-pay | Admitting: *Deleted

## 2019-07-26 ENCOUNTER — Ambulatory Visit: Payer: 59 | Admitting: Diagnostic Neuroimaging

## 2019-07-26 VITALS — BP 167/103 | HR 96 | Ht 73.0 in | Wt 250.0 lb

## 2019-07-26 DIAGNOSIS — F0781 Postconcussional syndrome: Secondary | ICD-10-CM

## 2019-07-26 MED ORDER — AMITRIPTYLINE HCL 25 MG PO TABS: 25.0000 mg | ORAL_TABLET | Freq: Every day | ORAL | 3 refills | Status: DC

## 2019-07-26 NOTE — Patient Instructions (Signed)
  POST-CONCUSSION SYNDROME - start amitriptyline 25mg  daily (migraine, insomnia, anxiety, depression)
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
## 2019-07-27 ENCOUNTER — Encounter: Payer: Self-pay | Admitting: Diagnostic Neuroimaging

## 2019-10-31 ENCOUNTER — Other Ambulatory Visit: Payer: Self-pay | Admitting: Diagnostic Neuroimaging

## 2019-10-31 NOTE — Telephone Encounter (Signed)
Rx refilled with note to pharmacy: future refills through PCP.
